# Patient Record
Sex: Male | Born: 1990 | Race: White | Hispanic: No | Smoking: Former smoker
Health system: Southern US, Community
[De-identification: ages and names within clinical notes are randomized; demographics above are authoritative.]

## PROBLEM LIST (undated history)

## (undated) DIAGNOSIS — Z8616 Personal history of COVID-19: Secondary | ICD-10-CM

## (undated) HISTORY — DX: Personal history of COVID-19: Z86.16

---

## 2020-01-07 ENCOUNTER — Emergency Department (HOSPITAL_COMMUNITY)
Admission: EM | Admit: 2020-01-07 | Discharge: 2020-01-07 | Disposition: A | Discharge status: Home / Self Care | Payer: 59 | Attending: Emergency Medicine | Admitting: Emergency Medicine

## 2020-01-07 ENCOUNTER — Encounter (HOSPITAL_COMMUNITY): Payer: Self-pay | Admitting: Emergency Medicine

## 2020-01-07 ENCOUNTER — Emergency Department (HOSPITAL_COMMUNITY): Payer: 59

## 2020-01-07 ENCOUNTER — Other Ambulatory Visit: Payer: Self-pay

## 2020-01-07 DIAGNOSIS — R059 Cough, unspecified: Secondary | ICD-10-CM | POA: Insufficient documentation

## 2020-01-07 DIAGNOSIS — Z20822 Contact with and (suspected) exposure to covid-19: Secondary | ICD-10-CM | POA: Insufficient documentation

## 2020-01-07 DIAGNOSIS — R519 Headache, unspecified: Secondary | ICD-10-CM | POA: Diagnosis not present

## 2020-01-07 DIAGNOSIS — R000 Tachycardia, unspecified: Secondary | ICD-10-CM | POA: Diagnosis not present

## 2020-01-07 DIAGNOSIS — R0602 Shortness of breath: Secondary | ICD-10-CM | POA: Insufficient documentation

## 2020-01-07 DIAGNOSIS — R0981 Nasal congestion: Secondary | ICD-10-CM | POA: Diagnosis not present

## 2020-01-07 DIAGNOSIS — R509 Fever, unspecified: Secondary | ICD-10-CM | POA: Insufficient documentation

## 2020-01-07 LAB — BASIC METABOLIC PANEL
Anion gap: 9 (ref 5–15)
BUN: 9 mg/dL (ref 6–20)
CO2: 24 mmol/L (ref 22–32)
Calcium: 9.2 mg/dL (ref 8.9–10.3)
Chloride: 105 mmol/L (ref 98–111)
Creatinine, Ser: 0.89 mg/dL (ref 0.61–1.24)
GFR, Estimated: >60 mL/min (ref >60)
Glucose, Bld: 125 mg/dL — ABNORMAL HIGH (ref 70–99)
Potassium: 3.9 mmol/L (ref 3.5–5.1)
Sodium: 138 mmol/L (ref 135–145)

## 2020-01-07 LAB — RESPIRATORY PANEL BY RT PCR (FLU A&B, COVID)
Influenza A by PCR: NEGATIVE
Influenza B by PCR: NEGATIVE
SARS Coronavirus 2 by RT PCR: NEGATIVE

## 2020-01-07 LAB — CBC WITH DIFFERENTIAL/PLATELET
Abs Immature Granulocytes: 0.13 10*3/uL — ABNORMAL HIGH (ref 0.00–0.07)
Basophils Absolute: 0.1 10*3/uL (ref 0.0–0.1)
Basophils Relative: 1 %
Eosinophils Absolute: 0.1 10*3/uL (ref 0.0–0.5)
Eosinophils Relative: 1 %
HCT: 40.2 % (ref 39.0–52.0)
Hemoglobin: 14.1 g/dL (ref 13.0–17.0)
Immature Granulocytes: 1 %
Lymphocytes Relative: 43 %
Lymphs Abs: 4.8 10*3/uL — ABNORMAL HIGH (ref 0.7–4.0)
MCH: 31 pg (ref 26.0–34.0)
MCHC: 35.1 g/dL (ref 30.0–36.0)
MCV: 88.4 fL (ref 80.0–100.0)
Monocytes Absolute: 0.6 10*3/uL (ref 0.1–1.0)
Monocytes Relative: 6 %
Neutro Abs: 5.3 10*3/uL (ref 1.7–7.7)
Neutrophils Relative %: 48 %
Platelets: 183 10*3/uL (ref 150–400)
RBC: 4.55 MIL/uL (ref 4.22–5.81)
RDW: 12.4 % (ref 11.5–15.5)
WBC: 11 10*3/uL — ABNORMAL HIGH (ref 4.0–10.5)
nRBC: 0 % (ref 0.0–0.2)

## 2020-01-07 LAB — URINALYSIS, ROUTINE W REFLEX MICROSCOPIC
Bilirubin Urine: NEGATIVE
Glucose, UA: NEGATIVE mg/dL
Hgb urine dipstick: NEGATIVE
Ketones, ur: NEGATIVE mg/dL
Leukocytes,Ua: NEGATIVE
Nitrite: NEGATIVE
Protein, ur: NEGATIVE mg/dL
Specific Gravity, Urine: 1.013 (ref 1.005–1.030)
pH: 8 (ref 5.0–8.0)

## 2020-01-07 LAB — LACTIC ACID, PLASMA: Lactic Acid, Venous: 1.6 mmol/L (ref 0.5–1.9)

## 2020-01-07 MED ORDER — SODIUM CHLORIDE 0.9 % IV BOLUS: 1000.0000 mL | Freq: Once | INTRAVENOUS | Status: DC

## 2020-01-07 MED ORDER — FLUTICASONE PROPIONATE 50 MCG/ACT NA SUSP: 2.0000 | Freq: Every day | NASAL | 0 refills | Status: AC

## 2020-01-07 MED ORDER — SODIUM CHLORIDE 0.9 % IV BOLUS
1000.0000 mL | Freq: Once | INTRAVENOUS | Status: AC
  Administered 2020-01-07: 1000 mL via INTRAVENOUS

## 2020-01-07 MED ORDER — ACETAMINOPHEN 500 MG PO TABS
1000.0000 mg | ORAL_TABLET | Freq: Once | ORAL | Status: AC
  Administered 2020-01-07: 1000 mg via ORAL
  Filled 2020-01-07: qty 2

## 2020-01-07 NOTE — ED Triage Notes (Signed)
Patient reports persistent fever since last week , diagnosed with sinusitis currently taking Amoxicillin , denies SOB or cough , denies loss or taste or smell , received Covid vaccination.

## 2020-01-07 NOTE — ED Provider Notes (Signed)
[facility] EMERGENCY DEPARTMENT Provider Note   CSN: [id number] Arrival date & time: 01/07/20  1949     History Chief Complaint  Patient presents with  . Fever    Arthur Ortega is a 29 y.o. male.  HPI   29 year old male presenting the emergency department today for evaluation of a fever.  Patient states for the last week he has had fevers, headaches, pressure to the sinuses, some nasal congestion and an intermittent cough.  He denies any chest pain.  He has had some intermittent shortness of breath when his fever gets high.  He denies any vomiting, diarrhea, urinary symptoms.  Denies any rashes or recent tick bites.  Denies any neck pain or stiffness.  He had an outpatient Covid test that was negative.  He has been fully vaccinated.  He was seen via telehealth visit yesterday and started on Augmentin.  He has had a total of 4 doses so far.  He became concerned because he had a temperature of 106F at home.  Denies any history of IV drug use.  He took motrin just PTA.   History reviewed. No pertinent past medical history.  There are no problems to display for this patient.   History reviewed. No pertinent surgical history.     No family history on file.  Social History   Tobacco Use  . Smoking status: Never Smoker  . Smokeless tobacco: Never Used  Substance Use Topics  . Alcohol use: Never  . Drug use: Never    Home Medications Prior to Admission medications   Medication Sig Start Date End Date Taking? Authorizing Provider  amoxicillin-clavulanate (AUGMENTIN) 875-125 MG tablet Take 1 tablet by mouth 2 (two) times daily. 01/06/20  Yes [provider]  SKYRIZI 150 MG/ML SOSY Inject 150 mg into the muscle every 3 (three) months. 11/12/19  Yes [provider]  fluticasone (FLONASE) 50 MCG/ACT nasal spray Place 2 sprays into both nostrils daily. 01/07/20   Cherre Kothari S, PA-C    Allergies    Patient has no known allergies.  Review  of Systems   Review of Systems  Constitutional: Positive for fever.  HENT: Positive for congestion and sinus pressure. Negative for ear pain.   Eyes: Negative for pain and visual disturbance.  Respiratory: Negative for cough and shortness of breath.   Cardiovascular: Negative for chest pain.  Gastrointestinal: Negative for abdominal pain, constipation, diarrhea, nausea and vomiting.  Genitourinary: Negative for dysuria and hematuria.  Musculoskeletal: Negative for back pain.  Skin: Negative for color change and rash.  Neurological: Positive for headaches.  All other systems reviewed and are negative.   Physical Exam Updated Vital Signs BP 101/63   Pulse (!) 107   Temp 99 F (37.2 C) (Oral)   Resp 16   Ht 5\' 6"  (1.676 m)   Wt 85 kg   SpO2 100%   BMI 30.25 kg/m   Physical Exam Vitals and nursing note reviewed.  Constitutional:      Appearance: He is well-developed. He is not ill-appearing.  HENT:     Head: Normocephalic and atraumatic.     Mouth/Throat:     Pharynx: No oropharyngeal exudate or posterior oropharyngeal erythema.  Eyes:     Extraocular Movements: Extraocular movements intact.     Conjunctiva/sclera: Conjunctivae normal.     Pupils: Pupils are equal, round, and reactive to light.  Cardiovascular:     Rate and Rhythm: Regular rhythm. Tachycardia present.     Heart sounds:  Normal heart sounds. No murmur heard.   Pulmonary:     Effort: Pulmonary effort is normal. No respiratory distress.     Breath sounds: Normal breath sounds. No wheezing or rales.  Abdominal:     General: Bowel sounds are normal.     Palpations: Abdomen is soft.     Tenderness: There is no abdominal tenderness.  Musculoskeletal:     Cervical back: Normal range of motion and neck supple. No rigidity.  Skin:    General: Skin is warm and dry.  Neurological:     Mental Status: He is alert.     Comments: Cranial nerves II-XII intact. Moving all extremities.     ED Results / Procedures  / Treatments   Labs (all labs ordered are listed, but only abnormal results are displayed) Labs Reviewed  CBC WITH DIFFERENTIAL/PLATELET - Abnormal; Notable for the following components:      Result Value   WBC 11.0 (*)    Lymphs Abs 4.8 (*)    Abs Immature Granulocytes 0.13 (*)    All other components within normal limits  BASIC METABOLIC PANEL - Abnormal; Notable for the following components:   Glucose, Bld 125 (*)    All other components within normal limits  RESPIRATORY PANEL BY RT PCR (FLU A&B, COVID)  CULTURE, BLOOD (ROUTINE X 2)  CULTURE, BLOOD (ROUTINE X 2)  URINALYSIS, ROUTINE W REFLEX MICROSCOPIC  LACTIC ACID, PLASMA    EKG EKG Interpretation  Date/Time:  Tuesday January 07 2020 20:07:26 EDT Ventricular Rate:  162 PR Interval:  124 QRS Duration: 76 QT Interval:  290 QTC Calculation: 476 R Axis:   51 Text Interpretation: Sinus tachycardia Septal infarct , age undetermined Abnormal ECG No old tracing to compare Confirmed by Meridee Score [phone]) on 01/07/2020 9:45:40 PM   Radiology DG Chest Portable 1 View  Result Date: 01/07/2020 CLINICAL DATA:  Fever since last week. EXAM: PORTABLE CHEST 1 VIEW COMPARISON:  None. FINDINGS: The cardiomediastinal contours are normal. The lungs are clear. Pulmonary vasculature is normal. No consolidation, pleural effusion, or pneumothorax. No acute osseous abnormalities are seen. IMPRESSION: Negative AP view of the chest. Electronically Signed   By: Narda Rutherford M.D.   On: 01/07/2020 20:57    Procedures Procedures (including critical care time)  Medications Ordered in ED Medications  sodium chloride 0.9 % bolus 1,000 mL ( Intravenous Restarted 01/07/20 2229)  acetaminophen (TYLENOL) tablet 1,000 mg (1,000 mg Oral Given 01/07/20 2222)    ED Course  I have reviewed the triage vital signs and the nursing notes.  Pertinent labs & imaging results that were available during my care of the patient were reviewed by me and  considered in my medical decision making (see chart for details).    MDM Rules/Calculators/A&P                          53 old male presenting the emergency department today for evaluation of fever and nasal congestion.  Has been on Augmentin for 2 days for sinus infection but became concerned because fevers were persistently high at home.  Initially is tachycardic and febrile.  He is not hypotensive, hypoxic and is well-appearing generally on exam.  Reviewed/interpreted labs CBC with very mild leukocytosis, no anemia BMP with normal electrolytes and renal function UA negative for UTI COVID negative Blood cultures obtained Lactic acid is negative, low suspicion for sepsis  EKG shows sinus tach with a heart rate of 162, age  intdeterminate septal infarct  CXR reviewed/interpreted -  Negative AP view of the chest  He was given Tylenol and IV fluids.  On reassessment his heart rate is improving. Low suspicion for sepsis at this time. sxs likely secondary to bacterial sinusitis. He has only had 2 days of outpatient abx, feel he has not necessarily failed outpatient therapy and should continue abx with close f/u with his pcp. Advised on specific return precautions. He voiced understanding of the plan and reasons to return. All questions answered, pt stable for discharge  Final Clinical Impression(s) / ED Diagnoses Final diagnoses:  Fever, unspecified fever cause    Rx / DC Orders ED Discharge Orders         Ordered    fluticasone (FLONASE) 50 MCG/ACT nasal spray  Daily        01/07/20 2327           Karrie Meres, PA-C 01/07/20 2332    Terrilee Files, MD 01/08/20 1233

## 2020-01-07 NOTE — ED Notes (Signed)
Fluids from 1st bolus still infusing. Pt resting comfortably

## 2020-01-07 NOTE — ED Notes (Signed)
Per PA ok to collect blood cultures after fluids

## 2020-01-07 NOTE — Discharge Instructions (Addendum)
Continue your antibiotics. Use 2 sprays of the fluticasone nasal spray to your bilateral nostrils once daily for nasal congestion.   A culture was sent of your blood today to determine if there is any bacterial growth. If the results of the culture are positive and you require an antibiotic or a change of your prescribed antibiotic you will be contacted by the hospital. If the results are negative you will not be contacted.  Please follow up with your primary care provider within 1-2 days for re-evaluation of your symptoms. If you do not have a primary care provider, information for a healthcare clinic has been provided for you to make arrangements for follow up care. Please return to the emergency department for any new or worsening symptoms.

## 2020-01-13 LAB — CULTURE, BLOOD (ROUTINE X 2)
Culture: NO GROWTH
Culture: NO GROWTH
Special Requests: ADEQUATE
Special Requests: ADEQUATE

## 2020-04-09 ENCOUNTER — Telehealth: Payer: 59 | Admitting: Family

## 2020-04-09 DIAGNOSIS — J019 Acute sinusitis, unspecified: Secondary | ICD-10-CM

## 2020-04-09 MED ORDER — AMOXICILLIN-POT CLAVULANATE 875-125 MG PO TABS: 1.0000 | ORAL_TABLET | Freq: Two times a day (BID) | ORAL | 0 refills | Status: AC

## 2020-04-09 NOTE — Progress Notes (Signed)
We are sorry that you are not feeling well.  Here is how we plan to help! ° °Based on what you have shared with me it looks like you have sinusitis.  Sinusitis is inflammation and infection in the sinus cavities of the head.  Based on your presentation I believe you most likely have Acute Bacterial Sinusitis.  This is an infection caused by bacteria and is treated with antibiotics. I have prescribed Augmentin 875mg/125mg one tablet twice daily with food, for 7 days. You may use an oral decongestant such as Mucinex D or if you have glaucoma or high blood pressure use plain Mucinex. Saline nasal spray help and can safely be used as often as needed for congestion.  If you develop worsening sinus pain, fever or notice severe headache and vision changes, or if symptoms are not better after completion of antibiotic, please schedule an appointment with a health care provider.   ° °Sinus infections are not as easily transmitted as other respiratory infection, however we still recommend that you avoid close contact with loved ones, especially the very young and elderly.  Remember to wash your hands thoroughly throughout the day as this is the number one way to prevent the spread of infection! ° °Home Care: °· Only take medications as instructed by your medical team. °· Complete the entire course of an antibiotic. °· Do not take these medications with alcohol. °· A steam or ultrasonic humidifier can help congestion.  You can place a towel over your head and breathe in the steam from hot water coming from a faucet. °· Avoid close contacts especially the very young and the elderly. °· Cover your mouth when you cough or sneeze. °· Always remember to wash your hands. ° °Get Help Right Away If: °· You develop worsening fever or sinus pain. °· You develop a severe head ache or visual changes. °· Your symptoms persist after you have completed your treatment plan. ° °Make sure you °· Understand these instructions. °· Will watch your  condition. °· Will get help right away if you are not doing well or get worse. ° °Your e-visit answers were reviewed by a board certified advanced clinical practitioner to complete your personal care plan.  Depending on the condition, your plan could have included both over the counter or prescription medications. ° °If there is a problem please reply  once you have received a response from your provider. ° °Your safety is important to us.  If you have drug allergies check your prescription carefully.   ° °You can use MyChart to ask questions about today’s visit, request a non-urgent call back, or ask for a work or school excuse for 24 hours related to this e-Visit. If it has been greater than 24 hours you will need to follow up with your provider, or enter a new e-Visit to address those concerns. ° °You will get an e-mail in the next two days asking about your experience.  I hope that your e-visit has been valuable and will speed your recovery. Thank you for using e-visits. ° °Greater than 5 minutes, yet less than 10 minutes of time have been spent researching, coordinating, and implementing care for this patient.  ° ° ° °

## 2020-04-16 ENCOUNTER — Emergency Department (HOSPITAL_COMMUNITY)
Admission: EM | Admit: 2020-04-16 | Discharge: 2020-04-16 | Disposition: A | Discharge status: Home / Self Care | Payer: Commercial Managed Care - PPO | Attending: Emergency Medicine | Admitting: Emergency Medicine

## 2020-04-16 ENCOUNTER — Encounter (HOSPITAL_COMMUNITY): Payer: Self-pay | Admitting: Emergency Medicine

## 2020-04-16 ENCOUNTER — Emergency Department (HOSPITAL_COMMUNITY): Payer: Commercial Managed Care - PPO

## 2020-04-16 DIAGNOSIS — U071 COVID-19: Secondary | ICD-10-CM | POA: Diagnosis not present

## 2020-04-16 DIAGNOSIS — R002 Palpitations: Secondary | ICD-10-CM | POA: Diagnosis not present

## 2020-04-16 DIAGNOSIS — R0602 Shortness of breath: Secondary | ICD-10-CM | POA: Diagnosis present

## 2020-04-16 DIAGNOSIS — R000 Tachycardia, unspecified: Secondary | ICD-10-CM | POA: Diagnosis not present

## 2020-04-16 DIAGNOSIS — F419 Anxiety disorder, unspecified: Secondary | ICD-10-CM

## 2020-04-16 LAB — CBC
HCT: 47.5 % (ref 39.0–52.0)
Hemoglobin: 16.9 g/dL (ref 13.0–17.0)
MCH: 30.8 pg (ref 26.0–34.0)
MCHC: 35.6 g/dL (ref 30.0–36.0)
MCV: 86.7 fL (ref 80.0–100.0)
Platelets: 314 10*3/uL (ref 150–400)
RBC: 5.48 MIL/uL (ref 4.22–5.81)
RDW: 12.6 % (ref 11.5–15.5)
WBC: 14.6 10*3/uL — ABNORMAL HIGH (ref 4.0–10.5)
nRBC: 0 % (ref 0.0–0.2)

## 2020-04-16 LAB — URINALYSIS, ROUTINE W REFLEX MICROSCOPIC
Bilirubin Urine: NEGATIVE
Glucose, UA: NEGATIVE mg/dL
Hgb urine dipstick: NEGATIVE
Ketones, ur: NEGATIVE mg/dL
Leukocytes,Ua: NEGATIVE
Nitrite: NEGATIVE
Protein, ur: NEGATIVE mg/dL
Specific Gravity, Urine: 1.01 (ref 1.005–1.030)
pH: 7 (ref 5.0–8.0)

## 2020-04-16 LAB — BASIC METABOLIC PANEL
Anion gap: 11 (ref 5–15)
BUN: 9 mg/dL (ref 6–20)
CO2: 22 mmol/L (ref 22–32)
Calcium: 9.8 mg/dL (ref 8.9–10.3)
Chloride: 104 mmol/L (ref 98–111)
Creatinine, Ser: 0.78 mg/dL (ref 0.61–1.24)
GFR, Estimated: >60 mL/min (ref >60)
Glucose, Bld: 136 mg/dL — ABNORMAL HIGH (ref 70–99)
Potassium: 4 mmol/L (ref 3.5–5.1)
Sodium: 137 mmol/L (ref 135–145)

## 2020-04-16 LAB — SARS CORONAVIRUS 2 BY RT PCR (HOSPITAL ORDER, PERFORMED IN ~~LOC~~ HOSPITAL LAB): SARS Coronavirus 2: POSITIVE — AB

## 2020-04-16 LAB — RAPID URINE DRUG SCREEN, HOSP PERFORMED
Amphetamines: NOT DETECTED
Barbiturates: NOT DETECTED
Benzodiazepines: NOT DETECTED
Cocaine: NOT DETECTED
Opiates: NOT DETECTED
Tetrahydrocannabinol: NOT DETECTED

## 2020-04-16 LAB — TROPONIN I (HIGH SENSITIVITY)
Troponin I (High Sensitivity): 3 ng/L (ref <18)
Troponin I (High Sensitivity): 3 ng/L (ref <18)

## 2020-04-16 LAB — TSH: TSH: 1.393 u[IU]/mL (ref 0.350–4.500)

## 2020-04-16 MED ORDER — ACETAMINOPHEN 500 MG PO TABS
1000.0000 mg | ORAL_TABLET | Freq: Once | ORAL | Status: AC
  Administered 2020-04-16: 1000 mg via ORAL
  Filled 2020-04-16: qty 2

## 2020-04-16 MED ORDER — LORAZEPAM 1 MG PO TABS: 1.0000 mg | ORAL_TABLET | Freq: Two times a day (BID) | ORAL | 0 refills | Status: AC | PRN

## 2020-04-16 MED ORDER — LACTATED RINGERS IV BOLUS
1000.0000 mL | Freq: Once | INTRAVENOUS | Status: AC
  Administered 2020-04-16: 1000 mL via INTRAVENOUS

## 2020-04-16 MED ORDER — METOPROLOL TARTRATE 5 MG/5ML IV SOLN
5.0000 mg | Freq: Once | INTRAVENOUS | Status: AC
  Administered 2020-04-16: 5 mg via INTRAVENOUS
  Filled 2020-04-16: qty 5

## 2020-04-16 MED ORDER — IOHEXOL 350 MG/ML SOLN
75.0000 mL | Freq: Once | INTRAVENOUS | Status: AC | PRN
  Administered 2020-04-16: 75 mL via INTRAVENOUS

## 2020-04-16 MED ORDER — SODIUM CHLORIDE 0.9 % IV BOLUS
1000.0000 mL | Freq: Once | INTRAVENOUS | Status: AC
  Administered 2020-04-16: 1000 mL via INTRAVENOUS

## 2020-04-16 MED ORDER — METOPROLOL SUCCINATE ER 25 MG PO TB24
12.5000 mg | ORAL_TABLET | Freq: Every day | ORAL | 0 refills | Status: DC
Start: 2020-04-16 — End: 2020-05-14

## 2020-04-16 MED ORDER — LORAZEPAM 2 MG/ML IJ SOLN
1.0000 mg | Freq: Once | INTRAMUSCULAR | Status: AC
  Administered 2020-04-16: 1 mg via INTRAVENOUS
  Filled 2020-04-16: qty 1

## 2020-04-16 NOTE — Discharge Instructions (Addendum)
Like we discussed, continue to treat your symptoms with over-the-counter medications.  You can take Tylenol for your fevers and body aches.  Consider Delsym or Robitussin for your cough.  I would also recommend that you take Flonase 2 times a day.  This medication has been shown in a recent study to have a significant reduction in progression of COVID-19 in folks like you who have mild symptoms.  I have also given you a referral for COVID-19 treatment.  They will be reaching out to you regarding possible outpatient options for treatment of your COVID-19.  If you develop worsening shortness of breath or chest pain, consider coming back to the emergency department for reevaluation.  It was a pleasure to meet you.

## 2020-04-16 NOTE — ED Provider Notes (Signed)
Pt is feeling much better after fluids, ativan, and lopressor.  HR is down to 107.  Pt will be d/c with a weeks worth of lopressor and a few ativan.  He is stable for d/c.  Return if worse.  Arthur Ortega was evaluated in Emergency Department on 04/16/2020 for the symptoms described in the history of present illness. He was evaluated in the context of the global COVID-19 pandemic, which necessitated consideration that the patient might be at risk for infection with the SARS-CoV-2 virus that causes COVID-19. Institutional protocols and algorithms that pertain to the evaluation of patients at risk for COVID-19 are in a state of rapid change based on information released by regulatory bodies including the CDC and federal and state organizations. These policies and algorithms were followed during the patient's care in the ED.   Jacalyn Lefevre, MD 04/16/20 [phone]

## 2020-04-16 NOTE — ED Provider Notes (Signed)
I provided a substantive portion of the care of this patient.  I personally performed the entirety of the history for this encounter.  EKG Interpretation  Date/Time:  Thursday April 16 2020 08:20:10 EST Ventricular Rate:  159 PR Interval:  120 QRS Duration: 72 QT Interval:  244 QTC Calculation: 396 R Axis:   52 Text Interpretation: Sinus tachycardia Otherwise normal ECG agree, no sig change from previous Confirmed by Arby Barrette [phone]) on 04/16/2020 8:42:53 AM   Patient reports he had a week or more of nasal congestion.  He tried some home Covid test that were negative.  He was treated with Augmentin.  His son just recently tested positive for Covid and so he took another test yesterday which was positive.  Last night his heart rate was going very high.  He was measuring it and it was up in the 160s.  He reports he could feel his heart racing and felt his chest somewhat tight.  No history of DVT.  Patient denies any drugs of abuse.  He has been taking some Mucinex DM.  Last dose was yesterday morning.  He denies taking frequent doses of cold medication.  No extensive consumption of caffeine.  No drugs of abuse.  Patient is alert and nontoxic.  Mental status clear.  No distress at rest.  Heart regular and tachycardic.  Lungs are clear.  Abdomen soft nontender.  No peripheral edema.  Calf soft nontender.  Patient does test positive for COVID.  He is persistently tachycardic.  There was some temporary improvement after his first liter of fluid rehydration.  Patient does report subjectively feeling better.  Heart rate remains elevated will continue to hydrate and reassess.  This time PE study is negative.   Arby Barrette, MD 04/16/20 [phone]

## 2020-04-16 NOTE — ED Triage Notes (Signed)
Pt reports being sick with a upper respiratory infection for the past 14 days, he was seen on 1/27 and diagnosed with sinusitis and just finished course on antibiotics. He took negative covid tests 2 weeks ago however his son tested + 1 week ago and he took a + covid test yesterday. Last night he began to feel short of breath and his heart was racing.

## 2020-04-16 NOTE — ED Provider Notes (Signed)
[facility] EMERGENCY DEPARTMENT Provider Note   CSN: [id number] Arrival date & time: 04/16/20  0813     History Chief Complaint  Patient presents with  . Shortness of Breath    Covid +     Arthur Ortega is a 30 y.o. male.  HPI Patient is a 30 year old male with a history of psoriasis who presents the emergency department due to shortness of breath.  Patient states about 2 weeks ago he began experiencing a viral URI.  He had a negative COVID-19 test at that time and was prescribed Augmentin which he completed yesterday.  He states that his young son recently tested positive for COVID-19 so he used an at-home test once again yesterday which he found to be positive.  Last night he began experiencing chest tightness, palpitations, as well as shortness of breath.  He felt that it was likely due to anxiety so he went to sleep and woke up this morning with a continuation of his symptoms so he came to the emergency department.  No nausea, vomiting, abdominal pain, URI symptoms at this time.  Denies any leg swelling or history of blood clots.  No hemoptysis.    History reviewed. No pertinent past medical history.  There are no problems to display for this patient.   No past surgical history on file.     No family history on file.  Social History   Tobacco Use  . Smoking status: Never Smoker  . Smokeless tobacco: Never Used  Substance Use Topics  . Alcohol use: Never  . Drug use: Never    Home Medications Prior to Admission medications   Medication Sig Start Date End Date Taking? Authorizing Provider  amoxicillin-clavulanate (AUGMENTIN) 875-125 MG tablet Take 1 tablet by mouth 2 (two) times daily for 7 days. 04/09/20 04/16/20 Yes Olive Bass, FNP  fluticasone (FLONASE) 50 MCG/ACT nasal spray Place 2 sprays into both nostrils daily. Patient taking differently: Place 2 sprays into both nostrils daily as needed for allergies or rhinitis. 01/07/20  Yes  Couture, Cortni S, PA-C  ibuprofen (ADVIL) 200 MG tablet Take 600 mg by mouth every 6 (six) hours as needed for mild pain, headache or fever (or discomfort).   Yes [provider]  Multiple Vitamins-Minerals (EMERGEN-C IMMUNE PLUS/VIT D PO) Take 1-2 tablets by mouth daily.   Yes [provider]  pseudoephedrine-guaifenesin (MUCINEX D) 60-600 MG 12 hr tablet Take 1 tablet by mouth every 12 (twelve) hours as needed for congestion.   Yes [provider]  SKYRIZI 150 MG/ML SOSY Inject 150 mg into the muscle every 3 (three) months. 11/12/19  Yes [provider]  augmented betamethasone dipropionate (DIPROLENE-AF) 0.05 % cream Apply topically. 04/09/20   [provider]  Clobetasol Propionate 0.05 % lotion Apply topically. 04/10/20   [provider]  ketoconazole (NIZORAL) 2 % shampoo Apply topically. 04/09/20   [provider]    Allergies    Patient has no known allergies.  Review of Systems   Review of Systems  All other systems reviewed and are negative. Ten systems reviewed and are negative for acute change, except as noted in the HPI.   Physical Exam Updated Vital Signs BP 118/80   Pulse (!) 146   Temp 99.3 F (37.4 C) (Oral)   Resp 17   SpO2 100%   Physical Exam Vitals and nursing note reviewed.  Constitutional:      General: He is not in acute distress.  Appearance: Normal appearance. He is well-developed and normal weight. He is not ill-appearing, toxic-appearing or diaphoretic.     Interventions: He is not intubated. HENT:     Head: Normocephalic and atraumatic.     Right Ear: External ear normal.     Left Ear: External ear normal.     Nose: Nose normal.     Mouth/Throat:     Mouth: Mucous membranes are moist.     Pharynx: Oropharynx is clear. No oropharyngeal exudate or posterior oropharyngeal erythema.  Eyes:     Extraocular Movements: Extraocular movements intact.  Cardiovascular:     Rate and Rhythm:  Regular rhythm. Tachycardia present.  No extrasystoles are present.    Pulses: Normal pulses. No decreased pulses.     Heart sounds: Normal heart sounds. No murmur heard. No friction rub. No gallop.      Comments: Tachycardic.  Fluctuating between 140 to 160 bpm. Pulmonary:     Effort: Pulmonary effort is normal. No tachypnea, bradypnea, accessory muscle usage or respiratory distress. He is not intubated.     Breath sounds: Normal breath sounds. No stridor. No decreased breath sounds, wheezing, rhonchi or rales.     Comments: LCTAB.  No wheezing, rales, or rhonchi.  Speaking in clear, complete, and coherent sentences. Abdominal:     General: Abdomen is flat.     Tenderness: There is no abdominal tenderness.  Musculoskeletal:        General: Normal range of motion.     Cervical back: Normal range of motion and neck supple. No tenderness.     Right lower leg: No tenderness. No edema.     Left lower leg: No tenderness. No edema.     Comments: No leg swelling or calf tenderness.  Skin:    General: Skin is warm and dry.  Neurological:     General: No focal deficit present.     Mental Status: He is alert and oriented to person, place, and time.  Psychiatric:        Mood and Affect: Mood normal.        Behavior: Behavior normal.    ED Results / Procedures / Treatments   Labs (all labs ordered are listed, but only abnormal results are displayed) Labs Reviewed  SARS CORONAVIRUS 2 BY RT PCR (HOSPITAL ORDER, PERFORMED IN [facility]) - Abnormal; Notable for the following components:      Result Value   SARS Coronavirus 2 POSITIVE (*)    All other components within normal limits  BASIC METABOLIC PANEL - Abnormal; Notable for the following components:   Glucose, Bld 136 (*)    All other components within normal limits  CBC - Abnormal; Notable for the following components:   WBC 14.6 (*)    All other components within normal limits  URINALYSIS, ROUTINE W REFLEX MICROSCOPIC   RAPID URINE DRUG SCREEN, HOSP PERFORMED  TSH  TROPONIN I (HIGH SENSITIVITY)  TROPONIN I (HIGH SENSITIVITY)   EKG EKG Interpretation  Date/Time:  Thursday April 16 2020 08:20:10 EST Ventricular Rate:  159 PR Interval:  120 QRS Duration: 72 QT Interval:  244 QTC Calculation: 396 R Axis:   52 Text Interpretation: Sinus tachycardia Otherwise normal ECG agree, no sig change from previous Confirmed by Arby Barrette [phone]) on 04/16/2020 8:42:53 AM  Radiology CT Angio Chest PE W and/or Wo Contrast  Result Date: 04/16/2020 CLINICAL DATA:  URI.  Heart racing.  Recent COVID exposure. EXAM: CT ANGIOGRAPHY CHEST WITH CONTRAST TECHNIQUE: Multidetector  CT imaging of the chest was performed using the standard protocol during bolus administration of intravenous contrast. Multiplanar CT image reconstructions and MIPs were obtained to evaluate the vascular anatomy. CONTRAST:  44mL OMNIPAQUE IOHEXOL 350 MG/ML SOLN COMPARISON:  None. FINDINGS: Cardiovascular: Satisfactory opacification of the pulmonary arteries to the segmental level. No evidence of pulmonary embolism when allowing for levels of motion artifact. Normal heart size. No pericardial effusion. Mediastinum/Nodes: Negative for adenopathy or mass. Lungs/Pleura: No pneumonia, edema, effusion, or air leak Upper Abdomen: Negative when accounting for early contrast excretion in the kidneys. Musculoskeletal: Negative Review of the MIP images confirms the above findings. IMPRESSION: Negative chest CTA.  No pulmonary embolism or pneumonia. Electronically Signed   By: Marnee Spring M.D.   On: 04/16/2020 11:03    Procedures Procedures   Medications Ordered in ED Medications  LORazepam (ATIVAN) injection 1 mg (has no administration in time range)  acetaminophen (TYLENOL) tablet 1,000 mg (1,000 mg Oral Given 04/16/20 0914)  iohexol (OMNIPAQUE) 350 MG/ML injection 75 mL (75 mLs Intravenous Contrast Given 04/16/20 1056)  sodium chloride 0.9 % bolus 1,000 mL  (0 mLs Intravenous Stopped 04/16/20 1302)  lactated ringers bolus 1,000 mL (1,000 mLs Intravenous New Bag/Given 04/16/20 1507)    ED Course  I have reviewed the triage vital signs and the nursing notes.  Pertinent labs & imaging results that were available during my care of the patient were reviewed by me and considered in my medical decision making (see chart for details).  Clinical Course as of 04/16/20 1539  Thu Apr 16, 2020  0853 WBC(!): 14.6 [LJ]  0927 Temp(!): 100.8 F (38.2 C) Given 1 g of Tylenol. [LJ]  1106 CT Angio Chest PE W and/or Wo Contrast IMPRESSION: Negative chest CTA. No pulmonary embolism or pneumonia. [LJ]  1106 Patient reassessed.  Heart rate improved to about 130 to 135 bpm.  We will give IV fluids.  We will continue to monitor. [LJ]  1116 SARS Coronavirus 2(!): POSITIVE [LJ]  1342 Patient reassessed.  Heart rate improved to the 90s but with any movement continues to be significantly tachycardic.  Patient discussed with and evaluated by my attending physician.  We will give an additional liter of IV fluids.  Will have nursing staff obtain orthostatics as well as ambulate patient with a pulse ox. [LJ]    Clinical Course User Index [LJ] Placido Sou, PA-C   MDM Rules/Calculators/A&P                          Pt is a 30 y.o. male patient presents to the emergency department with tachycardia, chest tightness, shortness of breath.  Found to be positive for COVID-19 today.  He has been vaccinated.  Not boosted.  Labs: CBC with a leukocytosis of 14.6, otherwise reassuring. BMP with a glucose of 136, otherwise reassuring.  Electrolytes within normal limits.  Normal kidney function. TSH within normal limits. COVID-19 test positive. Troponin of 3. UA negative. UDS negative.  Imaging: CTA of the chest was obtained which was negative.  No PE or pneumonia.  I, Placido Sou, PA-C, personally reviewed and evaluated these images and lab results as part of my medical  decision-making.  Patient initially experiencing sinus tachycardia around 150 bpm.  He was also found to be febrile at 100.8 F.  Given his tachycardia I obtained a CT of the chest which was negative.  Patient given APAP as well as IV fluids.  Fever is resolved.  Tachycardia has mildly improved but patient still significantly tachycardic around 130 bpm.  Discussed with evaluated by my attending physician as well.  Plan is to give additional IV fluids, Ativan, obtain orthostatic vital signs, as well as have the nursing staff ambulate patient and monitor his pulse ox.  It is the end of my shift and pt care is being transferred to Dr. Particia Nearing for reevaluation and disposition.   Note: Portions of this report may have been transcribed using voice recognition software. Every effort was made to ensure accuracy; however, inadvertent computerized transcription errors may be present.   Final Clinical Impression(s) / ED Diagnoses Final diagnoses:  COVID-19   Rx / DC Orders ED Discharge Orders         Ordered    Ambulatory referral for Covid Treatment        04/16/20 1316           Placido Sou, PA-C 04/16/20 1539    Arby Barrette, MD 04/21/20 [phone]

## 2020-05-06 ENCOUNTER — Encounter (HOSPITAL_COMMUNITY): Payer: Self-pay | Admitting: Emergency Medicine

## 2020-05-06 ENCOUNTER — Emergency Department (HOSPITAL_COMMUNITY): Payer: Commercial Managed Care - PPO

## 2020-05-06 ENCOUNTER — Emergency Department (HOSPITAL_COMMUNITY)
Admission: EM | Admit: 2020-05-06 | Discharge: 2020-05-06 | Disposition: A | Discharge status: Home / Self Care | Payer: Commercial Managed Care - PPO | Attending: Emergency Medicine | Admitting: Emergency Medicine

## 2020-05-06 ENCOUNTER — Emergency Department (HOSPITAL_BASED_OUTPATIENT_CLINIC_OR_DEPARTMENT_OTHER): Payer: Commercial Managed Care - PPO

## 2020-05-06 DIAGNOSIS — R0602 Shortness of breath: Secondary | ICD-10-CM | POA: Insufficient documentation

## 2020-05-06 DIAGNOSIS — R079 Chest pain, unspecified: Secondary | ICD-10-CM | POA: Insufficient documentation

## 2020-05-06 DIAGNOSIS — R42 Dizziness and giddiness: Secondary | ICD-10-CM | POA: Insufficient documentation

## 2020-05-06 DIAGNOSIS — R000 Tachycardia, unspecified: Secondary | ICD-10-CM | POA: Diagnosis not present

## 2020-05-06 LAB — CBC
HCT: 45.7 % (ref 39.0–52.0)
Hemoglobin: 16 g/dL (ref 13.0–17.0)
MCH: 31.1 pg (ref 26.0–34.0)
MCHC: 35 g/dL (ref 30.0–36.0)
MCV: 88.9 fL (ref 80.0–100.0)
Platelets: 249 10*3/uL (ref 150–400)
RBC: 5.14 MIL/uL (ref 4.22–5.81)
RDW: 13.1 % (ref 11.5–15.5)
WBC: 14.2 10*3/uL — ABNORMAL HIGH (ref 4.0–10.5)
nRBC: 0 % (ref 0.0–0.2)

## 2020-05-06 LAB — CBG MONITORING, ED: Glucose-Capillary: 147 mg/dL — ABNORMAL HIGH (ref 70–99)

## 2020-05-06 LAB — URINALYSIS, ROUTINE W REFLEX MICROSCOPIC
Bilirubin Urine: NEGATIVE
Glucose, UA: 150 mg/dL — AB
Hgb urine dipstick: NEGATIVE
Ketones, ur: NEGATIVE mg/dL
Leukocytes,Ua: NEGATIVE
Nitrite: NEGATIVE
Protein, ur: NEGATIVE mg/dL
Specific Gravity, Urine: 1.002 — ABNORMAL LOW (ref 1.005–1.030)
pH: 7 (ref 5.0–8.0)

## 2020-05-06 LAB — COMPREHENSIVE METABOLIC PANEL
ALT: 44 U/L (ref 0–44)
AST: 34 U/L (ref 15–41)
Albumin: 4.7 g/dL (ref 3.5–5.0)
Alkaline Phosphatase: 104 U/L (ref 38–126)
Anion gap: 13 (ref 5–15)
BUN: 12 mg/dL (ref 6–20)
CO2: 21 mmol/L — ABNORMAL LOW (ref 22–32)
Calcium: 10.1 mg/dL (ref 8.9–10.3)
Chloride: 104 mmol/L (ref 98–111)
Creatinine, Ser: 0.75 mg/dL (ref 0.61–1.24)
GFR, Estimated: >60 mL/min (ref >60)
Glucose, Bld: 144 mg/dL — ABNORMAL HIGH (ref 70–99)
Potassium: 3.9 mmol/L (ref 3.5–5.1)
Sodium: 138 mmol/L (ref 135–145)
Total Bilirubin: 0.6 mg/dL (ref 0.3–1.2)
Total Protein: 7.2 g/dL (ref 6.5–8.1)

## 2020-05-06 LAB — RAPID URINE DRUG SCREEN, HOSP PERFORMED
Amphetamines: NOT DETECTED
Barbiturates: NOT DETECTED
Benzodiazepines: NOT DETECTED
Cocaine: NOT DETECTED
Opiates: NOT DETECTED
Tetrahydrocannabinol: NOT DETECTED

## 2020-05-06 LAB — HEMOGLOBIN A1C
Hgb A1c MFr Bld: 4.9 % (ref 4.8–5.6)
Mean Plasma Glucose: 93.93 mg/dL

## 2020-05-06 LAB — TSH: TSH: 1.707 u[IU]/mL (ref 0.350–4.500)

## 2020-05-06 LAB — T4, FREE: Free T4: 0.84 ng/dL (ref 0.61–1.12)

## 2020-05-06 LAB — ECHOCARDIOGRAM COMPLETE
Area-P 1/2: 4.29 cm2
S' Lateral: 2.9 cm

## 2020-05-06 LAB — SEDIMENTATION RATE: Sed Rate: 0 mm/hr (ref 0–16)

## 2020-05-06 MED ORDER — METOPROLOL SUCCINATE ER 25 MG PO TB24: 25.0000 mg | ORAL_TABLET | Freq: Every day | ORAL | 1 refills | Status: DC

## 2020-05-06 MED ORDER — METOPROLOL TARTRATE 5 MG/5ML IV SOLN
5.0000 mg | Freq: Once | INTRAVENOUS | Status: AC
  Administered 2020-05-06: 5 mg via INTRAVENOUS
  Filled 2020-05-06: qty 5

## 2020-05-06 MED ORDER — METOPROLOL SUCCINATE ER 25 MG PO TB24
12.5000 mg | ORAL_TABLET | Freq: Every day | ORAL | Status: DC
  Administered 2020-05-06: 12.5 mg via ORAL
  Filled 2020-05-06: qty 1

## 2020-05-06 NOTE — Progress Notes (Signed)
  Echocardiogram 2D Echocardiogram has been performed.  Augustine Radar 05/06/2020, 12:16 PM

## 2020-05-06 NOTE — ED Provider Notes (Signed)
[facility] EMERGENCY DEPARTMENT Provider Note   CSN: [id number] Arrival date & time: 05/06/20  4098     History Chief Complaint  Patient presents with  . Dizziness    Ryne Mctigue is a 30 y.o. male.  HPI Patient was seen in the emergency department February 2 post COVID diagnosis with lightheadedness and tachycardia.  Clinically he had no respiratory distress and was nontoxic.  Patient was hydrated and given some Ativan for acutely stressful life event, with the thought that could be a contributing factor for tachycardia because clinically, the patient was not ill in appearance.  He was seen by myself at that time.  Patient was subsequently discharged with a prescription for metoprolol and alprazolam for prn use with recommended follow up.  Patient reports after his discharge, he took the alprazolam and metoprolol dose as recommended the following day.  Symptoms seem reasonably better and he did not take any more of these medications.  He reports this morning he got up and went to work.  He ate a normal snack before going in.  He reports then his heart started to race very fast and he started to feel lightheaded.  He reports he wears his apple watch and heart rates were up in the 160s.  He actually had his first follow-up appointment with the PCP scheduled for this morning.  He reports he wanted to wait for that appointment but the racing heart was persisting for so long he was starting to feel quite lightheaded and uncomfortable.  He tried one of the alprazolam and metoprolol as prescribed from his previous visit.  His symptoms did not really seem to be improving much.  At that point he determined to come to the emergency department.  He denies he is experiencing any chest pain with this.  He reports he does feel a slight fullness and shortness of breath when his heart is beating really fast.  When we got into more details, patient reports that he used to drink energy drinks but  has completely discontinued them since his visit to the emergency department hoping that this would not recur.    History reviewed. No pertinent past medical history.  There are no problems to display for this patient.   History reviewed. No pertinent surgical history.     No family history on file.  Social History   Tobacco Use  . Smoking status: Never Smoker  . Smokeless tobacco: Never Used  Substance Use Topics  . Alcohol use: Never  . Drug use: Never    Home Medications Prior to Admission medications   Medication Sig Start Date End Date Taking? Authorizing Provider  acetaminophen (TYLENOL) 500 MG tablet Take 500 mg by mouth every 6 (six) hours as needed for mild pain or headache.   Yes [provider]  cholecalciferol (VITAMIN D3) 25 MCG (1000 UNIT) tablet Take 1,000 Units by mouth daily.   Yes [provider]  fluticasone (FLONASE) 50 MCG/ACT nasal spray Place 2 sprays into both nostrils daily. Patient taking differently: Place 2 sprays into both nostrils daily as needed for allergies or rhinitis. 01/07/20  Yes Couture, Cortni S, PA-C  LORazepam (ATIVAN) 1 MG tablet Take 1 tablet (1 mg total) by mouth 2 (two) times daily as needed for anxiety. 04/16/20  Yes Jacalyn Lefevre, MD  metoprolol succinate (TOPROL-XL) 25 MG 24 hr tablet Take 0.5 tablets (12.5 mg total) by mouth daily. Take if HR is greater than 100 Patient taking differently: Take 12.5  mg by mouth daily as needed (if HR > 100). 04/16/20  Yes Jacalyn Lefevre, MD  metoprolol succinate (TOPROL-XL) 25 MG 24 hr tablet Take 1 tablet (25 mg total) by mouth daily. 05/06/20  Yes Arby Barrette, MD  Multiple Vitamins-Minerals (EMERGEN-C IMMUNE PLUS/VIT D PO) Take 1 tablet by mouth daily.   Yes [provider]  SKYRIZI 150 MG/ML SOSY Inject 150 mg into the muscle every 3 (three) months. 11/12/19  Yes [provider]  augmented betamethasone dipropionate (DIPROLENE-AF) 0.05 % cream Apply 1  application topically. 04/09/20   [provider]  Clobetasol Propionate 0.05 % lotion Apply topically. 04/10/20   [provider]  ketoconazole (NIZORAL) 2 % shampoo Apply 1 application topically 2 (two) times a week. 04/09/20   [provider]    Allergies    Patient has no known allergies.  Review of Systems   Review of Systems  Physical Exam Updated Vital Signs BP 126/79   Pulse (!) 129   Temp 98.3 F (36.8 C) (Oral)   Resp 20   SpO2 97%   Physical Exam  ED Results / Procedures / Treatments   Labs (all labs ordered are listed, but only abnormal results are displayed) Labs Reviewed  CBC - Abnormal; Notable for the following components:      Result Value   WBC 14.2 (*)    All other components within normal limits  URINALYSIS, ROUTINE W REFLEX MICROSCOPIC - Abnormal; Notable for the following components:   Color, Urine COLORLESS (*)    Specific Gravity, Urine 1.002 (*)    Glucose, UA 150 (*)    All other components within normal limits  COMPREHENSIVE METABOLIC PANEL - Abnormal; Notable for the following components:   CO2 21 (*)    Glucose, Bld 144 (*)    All other components within normal limits  CBG MONITORING, ED - Abnormal; Notable for the following components:   Glucose-Capillary 147 (*)    All other components within normal limits  RAPID URINE DRUG SCREEN, HOSP PERFORMED  SEDIMENTATION RATE  TSH  T4, FREE  HEMOGLOBIN A1C    EKG EKG Interpretation  Date/Time:  Wednesday May 06 2020 10:59:26 EST Ventricular Rate:  127 PR Interval:    QRS Duration: 85 QT Interval:  303 QTC Calculation: 441 R Axis:   28 Text Interpretation: Sinus tachycardia Ventricular premature complex Aberrant complex no sig change from previous. questionable subtle delta wave. Confirmed by Arby Barrette [phone]) on 05/06/2020 11:03:58 AM   Radiology DG Chest 2 View  Result Date: 05/06/2020 CLINICAL DATA:  Tachycardia EXAM: CHEST - 2 VIEW COMPARISON:   Chest radiograph January 07, 2020; chest CT April 16, 2020 FINDINGS: Lungs are clear. Heart size and pulmonary vascularity are normal. No adenopathy. No pneumothorax. No bone lesions. IMPRESSION: Lungs clear.  Cardiac silhouette normal. Electronically Signed   By: Bretta Bang III M.D.   On: 05/06/2020 09:03   ECHOCARDIOGRAM COMPLETE  Result Date: 05/06/2020    ECHOCARDIOGRAM REPORT   Patient Name:   AVEDIS BEVIS Date of Exam: 05/06/2020 Medical Rec #:  [id number]   Height:       66.0 in Accession #:    [id number]  Weight:       187.4 lb Date of Birth:  09-19-1990   BSA:          1.945 m Patient Age:    29 years    BP:           116/84 mmHg Patient Gender:  M           HR:           123 bpm. Exam Location:  Inpatient Procedure: Color Doppler and Cardiac Doppler Indications:    Tachycardia  History:        Patient has no prior history of Echocardiogram examinations.  Sonographer:    Eulah PontSarah Pirrotta RDCS Referring Phys: [id number] [facility] Jaslynn Thome IMPRESSIONS  1. Left ventricular ejection fraction, by estimation, is 60 to 65%. The left ventricle has normal function. The left ventricle has no regional wall motion abnormalities. Left ventricular diastolic parameters were normal.  2. Right ventricular systolic function is normal. The right ventricular size is normal.  3. The mitral valve is normal in structure. No evidence of mitral valve regurgitation. No evidence of mitral stenosis.  4. The aortic valve is normal in structure. Aortic valve regurgitation is not visualized. No aortic stenosis is present.  5. The inferior vena cava is normal in size with greater than 50% respiratory variability, suggesting right atrial pressure of 3 mmHg. FINDINGS  Left Ventricle: Left ventricular ejection fraction, by estimation, is 60 to 65%. The left ventricle has normal function. The left ventricle has no regional wall motion abnormalities. The left ventricular internal cavity size was normal in size. There is  no left ventricular  hypertrophy. Left ventricular diastolic parameters were normal. Normal left ventricular filling pressure. Right Ventricle: The right ventricular size is normal. No increase in right ventricular wall thickness. Right ventricular systolic function is normal. Left Atrium: Left atrial size was normal in size. Right Atrium: Right atrial size was normal in size. Pericardium: There is no evidence of pericardial effusion. Mitral Valve: The mitral valve is normal in structure. No evidence of mitral valve regurgitation. No evidence of mitral valve stenosis. Tricuspid Valve: The tricuspid valve is normal in structure. Tricuspid valve regurgitation is trivial. No evidence of tricuspid stenosis. Aortic Valve: The aortic valve is normal in structure. Aortic valve regurgitation is not visualized. No aortic stenosis is present. Pulmonic Valve: The pulmonic valve was normal in structure. Pulmonic valve regurgitation is not visualized. No evidence of pulmonic stenosis. Aorta: The aortic root is normal in size and structure. Venous: The inferior vena cava is normal in size with greater than 50% respiratory variability, suggesting right atrial pressure of 3 mmHg. IAS/Shunts: No atrial level shunt detected by color flow Doppler.  LEFT VENTRICLE PLAX 2D LVIDd:         4.00 cm  Diastology LVIDs:         2.90 cm  LV e' medial:    10.10 cm/s LV PW:         0.80 cm  LV E/e' medial:  7.0 LV IVS:        0.80 cm  LV e' lateral:   13.60 cm/s LVOT diam:     2.10 cm  LV E/e' lateral: 5.2 LV SV:         46 LV SV Index:   24 LVOT Area:     3.46 cm  RIGHT VENTRICLE RV S prime:     21.70 cm/s TAPSE (M-mode): 1.8 cm LEFT ATRIUM             Index       RIGHT ATRIUM          Index LA diam:        2.60 cm 1.34 cm/m  RA Area:     7.66 cm LA Vol (A2C):   32.3 ml 16.60 ml/m  RA Volume:   12.10 ml 6.22 ml/m LA Vol (A4C):   33.2 ml 17.07 ml/m LA Biplane Vol: 35.9 ml 18.46 ml/m  AORTIC VALVE LVOT Vmax:   79.80 cm/s LVOT Vmean:  61.400 cm/s LVOT VTI:     0.133 m  AORTA Ao Root diam: 3.30 cm Ao Asc diam:  3.00 cm MITRAL VALVE MV Area (PHT): 4.29 cm    SHUNTS MV Decel Time: 177 msec    Systemic VTI:  0.13 m MV E velocity: 70.60 cm/s  Systemic Diam: 2.10 cm MV A velocity: 91.90 cm/s MV E/A ratio:  0.77 Armanda Magic MD Electronically signed by Armanda Magic MD Signature Date/Time: 05/06/2020/12:37:59 PM    Final     Procedures Procedures   Medications Ordered in ED Medications  metoprolol tartrate (LOPRESSOR) injection 5 mg (has no administration in time range)  metoprolol succinate (TOPROL-XL) 24 hr tablet 12.5 mg (has no administration in time range)    ED Course  I have reviewed the triage vital signs and the nursing notes.  Pertinent labs & imaging results that were available during my care of the patient were reviewed by me and considered in my medical decision making (see chart for details).  Clinical Course as of 05/06/20 1407  Wed May 06, 2020  1128 Consult: Reviewed with cardiology Dr.sunit Tolia.  He has reviewed EKGs.  Does not suspect WPW.  Recommends obtaining echo in the emergency department.  If echo shows no structural disease plan to discharge on Toprol-XL 25 mg daily and he will schedule patient for outpatient follow-up in cardiology clinic [MP]    Clinical Course User Index [MP] Arby Barrette, MD   MDM Rules/Calculators/A&P                          Patient presents with recurrent episodes of high rate sinus tachycardia.  Patient had been evaluated by myself 10 days previously.  At this time, there does not appear to be interim development of infectious cause.  Metabolic panel and diagnostic work-up remains within normal limits.  Echocardiogram added per consultation with cardiology.  Patient remains clinically well in appearance.  At this time structural cardiac etiology, metabolic causes, and medication/environmental causes appear ruled out.  We will proceed with initiating metoprolol per discussion with Dr. Sharin Grave.  Patient  will have follow-up with cardiology and return precautions reviewed. Final Clinical Impression(s) / ED Diagnoses Final diagnoses:  Sinus tachycardia  Lightheadedness    Rx / DC Orders ED Discharge Orders         Ordered    metoprolol succinate (TOPROL-XL) 25 MG 24 hr tablet  Daily        05/06/20 1339           Arby Barrette, MD 05/10/20 [phone]

## 2020-05-06 NOTE — ED Triage Notes (Signed)
Patient here with complaint of lightheadedness and shortness of breath that started suddenly around 0600 this morning. Heart rate is 140 in triage. Patient was recently prescribed lorazepam for anxiety which he took this morning with no relief. Patient states he was also recently prescribed metoprolol for when his heart rate is over 100bpm but he did not have that with him at work this morning so he did not take it. Patient alert, oriented, and in no apparent distress at this time.

## 2020-05-06 NOTE — Discharge Instructions (Addendum)
1.  Start taking Toprol-XL 25 mg tomorrow morning.  Monitor your heart rate and your blood pressure.  If your heart rate is going lower than 60 to 70 bpm, take half of the dose the next day.  If your blood pressure is less than 110/60 and/or you feel lightheaded, decrease your dose by half. 2.  Follow-up with Dr. Odis Hollingshead, a [facility].  March 3 at 3 PM as scheduled. 3.  Return to the emergency department if you have worsening or new and concerning symptoms

## 2020-05-06 NOTE — ED Notes (Signed)
Pt ambulated well around the room o2 never dropped below 98% pt did not feel like he was short of breath

## 2020-05-14 ENCOUNTER — Encounter: Payer: Self-pay | Admitting: Cardiology

## 2020-05-14 ENCOUNTER — Ambulatory Visit: Payer: Commercial Managed Care - PPO | Admitting: Cardiology

## 2020-05-14 ENCOUNTER — Other Ambulatory Visit: Payer: Self-pay

## 2020-05-14 VITALS — BP 141/83 | HR 91 | Temp 99.6°F | Resp 16 | Ht 66.0 in | Wt 173.4 lb

## 2020-05-14 DIAGNOSIS — R002 Palpitations: Secondary | ICD-10-CM

## 2020-05-14 DIAGNOSIS — Z8616 Personal history of COVID-19: Secondary | ICD-10-CM

## 2020-05-14 DIAGNOSIS — Z87891 Personal history of nicotine dependence: Secondary | ICD-10-CM

## 2020-05-14 DIAGNOSIS — R000 Tachycardia, unspecified: Secondary | ICD-10-CM

## 2020-05-14 MED ORDER — METOPROLOL SUCCINATE ER 25 MG PO TB24: 25.0000 mg | ORAL_TABLET | Freq: Every day | ORAL | 0 refills | Status: DC

## 2020-05-14 NOTE — Progress Notes (Signed)
Date:  05/14/2020   ID:  Eusebio Friendly, DOB Aug 03, 1990, MRN [id number]  PCP:  Patient, No Pcp Per  Cardiologist:  Tessa Lerner, DO, [facility] (established care 05/14/2020)  REASON FOR CONSULT: Palpitations and tachycardia and hospitalization follow-up  REQUESTING PHYSICIAN:  Dr. Clarice Pole, [facility], ER physician  Chief Complaint  Patient presents with  . Tachycardia  . Hospitalization Follow-up    HPI  Arthur Ortega is a 30 y.o. male who presents to the office with a chief complaint of " palpitations." Patient's past medical history and cardiovascular risk factors include: Former smoker, history of COVID-19 infection.  Patient is referred to the office after recent hospitalization visits in the ER for palpitations/tachycardia  Palpitations: Patient states that his symptoms of palpitations started about 3 to 4 weeks ago right after his COVID-19 infection.  Since then he has had at least 2 ER visits for similar complaints.  He has undergone CT PE protocol which is negative for pulmonary embolism and pneumonia per impressions.  Troponins were negative x2.  At the last ER visit he was recommended to have an echocardiogram to rule out structural heart disease and to evaluate LVEF.  And he was discharged home on Toprol-XL 25 mg p.o. daily.  He now presents for follow-up.  Since initiation of metoprolol patient states that his symptoms have improved but not resolved.  He notices palpitations mostly in the early hours of the morning when he goes to sleep at night.  For the last 1 month he has completely stopped the consumption of coffee or energy drinks.  He does not use any herbal supplements, over-the-counter medications, stimulants, or weight loss medications.  Associated symptoms include shortness of breath.  Symptoms do improve with applying an ice pack to the forehead.  Patient has an upcoming appointment to establish care with PCP later in March 2022.  Patient states that he works 2 jobs, has two  kids under the year of 2, and her youngest daughter heart surgery for congenital heart in [place] later this month as well.  FUNCTIONAL STATUS: Works at a [employer]. No structured exercise program or daily routine.   ALLERGIES: No Known Allergies  MEDICATION LIST PRIOR TO VISIT: Current Meds  Medication Sig  . acetaminophen (TYLENOL) 500 MG tablet Take 500 mg by mouth every 6 (six) hours as needed for mild pain or headache.  . Clobetasol Propionate 0.05 % lotion Apply topically.  . fluticasone (FLONASE) 50 MCG/ACT nasal spray Place 2 sprays into both nostrils daily. (Patient taking differently: Place 2 sprays into both nostrils daily as needed for allergies or rhinitis.)  . ketoconazole (NIZORAL) 2 % shampoo Apply 1 application topically 2 (two) times a week.  Marland Kitchen LORazepam (ATIVAN) 1 MG tablet Take 1 tablet (1 mg total) by mouth 2 (two) times daily as needed for anxiety.  . Multiple Vitamins-Minerals (EMERGEN-C IMMUNE PLUS/VIT D PO) Take 1 tablet by mouth daily.  . SKYRIZI 150 MG/ML SOSY Inject 150 mg into the muscle every 3 (three) months.  . [DISCONTINUED] metoprolol succinate (TOPROL-XL) 25 MG 24 hr tablet Take 1 tablet (25 mg total) by mouth daily.     PAST MEDICAL HISTORY: Past Medical History:  Diagnosis Date  . History of COVID-19     PAST SURGICAL HISTORY: History reviewed. No pertinent surgical history.  FAMILY HISTORY: The patient family history includes Drug abuse in his mother.  SOCIAL HISTORY:  The patient  reports that he quit smoking about 2 years ago. His smoking use included  cigarettes. He has a 1.50 pack-year smoking history. He has never used smokeless tobacco. He reports current alcohol use. He reports previous drug use. Drug: Marijuana.  REVIEW OF SYSTEMS: Review of Systems  Constitutional: Negative for chills and fever.  HENT: Negative for hoarse voice and nosebleeds.   Eyes: Negative for discharge, double vision and pain.  Cardiovascular: Positive  for palpitations. Negative for chest pain, claudication, dyspnea on exertion, leg swelling, near-syncope, orthopnea, paroxysmal nocturnal dyspnea and syncope.  Respiratory: Positive for shortness of breath. Negative for hemoptysis.   Musculoskeletal: Negative for muscle cramps and myalgias.  Gastrointestinal: Negative for abdominal pain, constipation, diarrhea, hematemesis, hematochezia, melena, nausea and vomiting.  Neurological: Negative for dizziness and light-headedness.    PHYSICAL EXAM: Vitals with BMI 05/14/2020 05/14/2020 05/06/2020  Height - 5\' 6"  -  Weight - 173 lbs 6 oz -  BMI - 28 -  Systolic 141 136 -  Diastolic 83 91 -  Pulse 91 91    CONSTITUTIONAL: Well-developed and well-nourished. No acute distress.  SKIN: Skin is warm and dry. No rash noted. No cyanosis. No pallor. No jaundice HEAD: Normocephalic and atraumatic.  EYES: No scleral icterus MOUTH/THROAT: Moist oral membranes.  NECK: No JVD present. No thyromegaly noted. No carotid bruits  LYMPHATIC: No visible cervical adenopathy.  CHEST Normal respiratory effort. No intercostal retractions  LUNGS: Clear to auscultation bilaterally.  No stridor. No wheezes. No rales.  CARDIOVASCULAR: Tachycardia, positive S1-S2, no murmurs rubs or gallops appreciated. ABDOMINAL: No apparent ascites.  EXTREMITIES: No peripheral edema  HEMATOLOGIC: No significant bruising NEUROLOGIC: Oriented to person, place, and time. Nonfocal. Normal muscle tone.  PSYCHIATRIC: Normal mood and affect. Normal behavior. Cooperative  Radiology: CTA chest PE protocol 04/16/2020: Negative chest CTA.  No pulmonary embolism or pneumonia.  CARDIAC DATABASE: EKG: 05/14/2020: Normal sinus rhythm, 99 bpm, normal axis, without underlying injury pattern.  Echocardiogram: 05/06/2020: 1. Left ventricular ejection fraction, by estimation, is 60 to 65%. The left ventricle has normal function. The left ventricle has no regional wall motion abnormalities. Left  ventricular diastolic parameters were normal.  2. Right ventricular systolic function is normal. The right ventricular size is normal.  3. The mitral valve is normal in structure. No evidence of mitral valve regurgitation. No evidence of mitral stenosis.  4. The aortic valve is normal in structure. Aortic valve regurgitation is not visualized. No aortic stenosis is present.  5. The inferior vena cava is normal in size with greater than 50% respiratory variability, suggesting right atrial pressure of 3 mmHg.   Stress Testing: No results found for this or any previous visit from the past 1095 days.  LABORATORY DATA: CBC Latest Ref Rng & Units 05/06/2020 04/16/2020 01/07/2020  WBC 4.0 - 10.5 K/uL 14.2(H) 14.6(H) 11.0(H)  Hemoglobin 13.0 - 17.0 g/dL 01/09/2020 62.6 94.8  Hematocrit 39.0 - 52.0 % 45.7 47.5 40.2  Platelets 150 - 400 K/uL 249 314 183    CMP Latest Ref Rng & Units 05/06/2020 04/16/2020 01/07/2020  Glucose 70 - 99 mg/dL 01/09/2020) 270(J) 500(X)  BUN 6 - 20 mg/dL 12 9 9   Creatinine 0.61 - 1.24 mg/dL 381(W 2.99  Sodium 135 - 145 mmol/L 138 137 138  Potassium 3.5 - 5.1 mmol/L 3.9 4.0 3.9  Chloride 98 - 111 mmol/L 104 104 105  CO2 22 - 32 mmol/L 21(L) 22 24  Calcium 8.9 - 10.3 mg/dL 3.71 9.8 9.2  Total Protein 6.5 - 8.1 g/dL 7.2 - -  Total Bilirubin 0.3 - 1.2 mg/dL  0.6 - -  Alkaline Phos 38 - 126 U/L 104 - -  AST 15 - 41 U/L 34 - -  ALT 0 - 44 U/L 44 - -    Lipid Panel  No results found for: CHOL, TRIG, HDL, CHOLHDL, VLDL, LDLCALC, LDLDIRECT, LABVLDL  No components found for: NTPROBNP No results for input(s): PROBNP in the last 8760 hours. Recent Labs    04/16/20 0922 05/06/20 0845  TSH 1.393 1.707    BMP Recent Labs    01/07/20 2019 04/16/20 0832 05/06/20 0845  NA 138 137 138  K 3.9 4.0 3.9  CL 105 104 104  CO2 24 22 21*  GLUCOSE 125* 136* 144*  BUN 9 9 12   CREATININE 0.89 0.78 0.75  CALCIUM 9.2 9.8 10.1  GFRNONAA >60 >60 >60    HEMOGLOBIN A1C Lab Results   Component Value Date   HGBA1C 4.9 05/06/2020   MPG 93.93 05/06/2020    IMPRESSION:    ICD-10-CM   1. Palpitations  R00.2 metoprolol succinate (TOPROL-XL) 25 MG 24 hr tablet    LONG TERM MONITOR (3-14 DAYS)  2. Tachycardia  R00.0 EKG 12-Lead    metoprolol succinate (TOPROL-XL) 25 MG 24 hr tablet  3. History of COVID-19  Z86.16   4. Former smoker  Z87.891      RECOMMENDATIONS: Arthur Ortega is a 30 y.o. male whose past medical history and cardiac risk factors include: COVID-19 infection, former smoker.  Palpitations: No known reversible causes. Started shortly after his COVID-19 infection. TSH within normal limits. Patient had to ER visits for similar complaints.  He is undergone a CTA PE protocol findings noted above.  Troponins have been negative x2 in the past. He is also undergone an echocardiogram which notes normal LVEF without any significant pericardial effusion. Patient symptoms have improved since initiation of Toprol-XL. 14-day extended Holter monitor to evaluate for dysrhythmias. He is educated on importance of decreasing the amount of stress, sleeping 6 to 8 hours a day, staying well-hydrated, and eating 3 balanced meals.  In addition, when the symptoms arise he is educated on bearing down, Valsalva maneuvers, or applying cold water to his face to see if the symptoms are terminated. EKG performed and personally interpreted and findings noted above. Patient has upcoming appointment with his currently managed by primary care provider.  I have asked him to have another CBC done to make sure the white count is trending down. I suspect that secondary to his recent COVID-19 infection and underlying inflammation.   However, we will check a cardiac monitor to rule out the presence of dysrhythmias.  Sinus tachycardia: Continue Toprol-XL 25 mg p.o. daily.  Continue to monitor  Former smoker: Educated on the importance of continued smoking cessation.  FINAL MEDICATION LIST END OF  ENCOUNTER: Meds ordered this encounter  Medications  . metoprolol succinate (TOPROL-XL) 25 MG 24 hr tablet    Sig: Take 1 tablet (25 mg total) by mouth daily.    Dispense:  30 tablet    Refill:  0     Current Outpatient Medications:  .  acetaminophen (TYLENOL) 500 MG tablet, Take 500 mg by mouth every 6 (six) hours as needed for mild pain or headache., Disp: , Rfl:  .  Clobetasol Propionate 0.05 % lotion, Apply topically., Disp: , Rfl:  .  fluticasone (FLONASE) 50 MCG/ACT nasal spray, Place 2 sprays into both nostrils daily. (Patient taking differently: Place 2 sprays into both nostrils daily as needed for allergies or rhinitis.), Disp: 16  g, Rfl: 0 .  ketoconazole (NIZORAL) 2 % shampoo, Apply 1 application topically 2 (two) times a week., Disp: , Rfl:  .  LORazepam (ATIVAN) 1 MG tablet, Take 1 tablet (1 mg total) by mouth 2 (two) times daily as needed for anxiety., Disp: 8 tablet, Rfl: 0 .  Multiple Vitamins-Minerals (EMERGEN-C IMMUNE PLUS/VIT D PO), Take 1 tablet by mouth daily., Disp: , Rfl:  .  SKYRIZI 150 MG/ML SOSY, Inject 150 mg into the muscle every 3 (three) months., Disp: , Rfl:  .  metoprolol succinate (TOPROL-XL) 25 MG 24 hr tablet, Take 1 tablet (25 mg total) by mouth daily., Disp: 30 tablet, Rfl: 0  Orders Placed This Encounter  Procedures  . LONG TERM MONITOR (3-14 DAYS)  . EKG 12-Lead    There are no Patient Instructions on file for this visit.   --Continue cardiac medications as reconciled in final medication list. --Return in about 6 weeks (around 06/25/2020) for Palpitations. Or sooner if needed. --Continue follow-up with your primary care physician regarding the management of your other chronic comorbid conditions.  Patient's questions and concerns were addressed to his satisfaction. He voices understanding of the instructions provided during this encounter.   This note was created using a voice recognition software as a result there may be grammatical errors  inadvertently enclosed that do not reflect the nature of this encounter. Every attempt is made to correct such errors.  Total encounter time 65 minutes. *Total Encounter Time as defined by the Centers for Medicare and Medicaid Services includes, in addition to the face-to-face time of a patient visit (documented in the note above) non-face-to-face time: obtaining and reviewing outside history, reviewed prior ER visit summaries, CTA PE protocol, echocardiogram results, lab work, ordering and reviewing medications, tests or procedures, care coordination (communications with other health care professionals or caregivers) and documentation in the medical record.   Tessa LernerSunit Demarques Pilz, [place]DO, Lowell General HospitalFACC  Pager: [phone] Office: [phone]

## 2020-05-18 ENCOUNTER — Inpatient Hospital Stay: Payer: Commercial Managed Care - PPO

## 2020-05-18 DIAGNOSIS — R002 Palpitations: Secondary | ICD-10-CM

## 2020-06-23 ENCOUNTER — Other Ambulatory Visit: Payer: Self-pay

## 2020-06-23 ENCOUNTER — Inpatient Hospital Stay: Payer: Commercial Managed Care - PPO

## 2020-06-23 ENCOUNTER — Ambulatory Visit: Payer: Commercial Managed Care - PPO | Admitting: Cardiology

## 2020-06-29 ENCOUNTER — Other Ambulatory Visit: Payer: Self-pay

## 2020-06-29 DIAGNOSIS — R000 Tachycardia, unspecified: Secondary | ICD-10-CM

## 2020-06-29 DIAGNOSIS — R002 Palpitations: Secondary | ICD-10-CM

## 2020-06-30 MED ORDER — METOPROLOL SUCCINATE ER 25 MG PO TB24: 25.0000 mg | ORAL_TABLET | Freq: Every day | ORAL | 0 refills | Status: DC

## 2020-08-04 ENCOUNTER — Other Ambulatory Visit: Payer: Self-pay

## 2020-08-04 DIAGNOSIS — R000 Tachycardia, unspecified: Secondary | ICD-10-CM

## 2020-08-04 DIAGNOSIS — R002 Palpitations: Secondary | ICD-10-CM

## 2020-08-05 ENCOUNTER — Other Ambulatory Visit: Payer: Self-pay

## 2020-08-05 DIAGNOSIS — R000 Tachycardia, unspecified: Secondary | ICD-10-CM

## 2020-08-05 DIAGNOSIS — R002 Palpitations: Secondary | ICD-10-CM

## 2020-08-05 MED ORDER — METOPROLOL SUCCINATE ER 25 MG PO TB24: 25.0000 mg | ORAL_TABLET | Freq: Every day | ORAL | 0 refills | Status: DC

## 2020-08-13 ENCOUNTER — Other Ambulatory Visit: Payer: Self-pay

## 2020-08-13 ENCOUNTER — Encounter: Payer: Self-pay | Admitting: Cardiology

## 2020-08-13 ENCOUNTER — Ambulatory Visit: Payer: Commercial Managed Care - PPO | Admitting: Cardiology

## 2020-08-13 VITALS — BP 123/80 | HR 96 | Temp 98.3°F | Resp 16 | Ht 66.0 in | Wt 175.8 lb

## 2020-08-13 DIAGNOSIS — R002 Palpitations: Secondary | ICD-10-CM

## 2020-08-13 DIAGNOSIS — Z712 Person consulting for explanation of examination or test findings: Secondary | ICD-10-CM

## 2020-08-13 DIAGNOSIS — R000 Tachycardia, unspecified: Secondary | ICD-10-CM

## 2020-08-13 DIAGNOSIS — Z8616 Personal history of COVID-19: Secondary | ICD-10-CM

## 2020-08-13 DIAGNOSIS — Z87891 Personal history of nicotine dependence: Secondary | ICD-10-CM

## 2020-08-13 NOTE — Progress Notes (Signed)
Date:  08/13/2020   ID:  Eusebio Friendly, DOB 13-Nov-1990, MRN [id number]  PCP:  Patient, No Pcp Per (Inactive)  Cardiologist:  Tessa Lerner, DO, [facility] (established care 05/14/2020)  Date: 08/13/20 Last Office Visit: 05/14/2020  Chief Complaint  Patient presents with  . Palpitations  . Follow-up    6 weeks    HPI  Arthur Ortega is a 30 y.o. male who presents to the office with a chief complaint of " reevaluation of palpitations and review test results." Patient's past medical history and cardiovascular risk factors include: Former smoker, history of COVID-19 infection.  Patient is referred to the office after recent hospitalization visits in the ER for palpitations/tachycardia  Patient is here for a 68-month follow-up visit to reevaluate palpitations.  Patient states that he is currently taking Toprol-XL on a daily basis and it has helped his symptoms significantly.  At times he notices random episodes of palpitations usually at night while he is resting and takes Xanax as needed.  It appears that the Xanax has helped his symptoms for now.  Patient had a 14-day extended Holter monitor since last office visit results were reviewed and overall were very favorable.  Since last visit he is also had a COVID-19 booster and has done well.  Patient states that the amount of stress that he was dealing with has improved significantly.  But still works 2 jobs.  FUNCTIONAL STATUS: Works at a [employer]. No structured exercise program or daily routine.   ALLERGIES: No Known Allergies  MEDICATION LIST PRIOR TO VISIT: Current Meds  Medication Sig  . acetaminophen (TYLENOL) 500 MG tablet Take 500 mg by mouth every 6 (six) hours as needed for mild pain or headache.  . fluticasone (FLONASE) 50 MCG/ACT nasal spray Place 2 sprays into both nostrils daily. (Patient taking differently: Place 2 sprays into both nostrils daily as needed for allergies or rhinitis.)  . LORazepam (ATIVAN) 1 MG tablet Take 1  tablet (1 mg total) by mouth 2 (two) times daily as needed for anxiety.  . metoprolol succinate (TOPROL-XL) 25 MG 24 hr tablet Take 1 tablet (25 mg total) by mouth daily.  . Multiple Vitamins-Minerals (EMERGEN-C IMMUNE PLUS/VIT D PO) Take 1 tablet by mouth daily.  . SKYRIZI 150 MG/ML SOSY Inject 150 mg into the muscle every 3 (three) months.     PAST MEDICAL HISTORY: Past Medical History:  Diagnosis Date  . History of COVID-19     PAST SURGICAL HISTORY: History reviewed. No pertinent surgical history.  FAMILY HISTORY: The patient family history includes Drug abuse in his mother; Heart disease in his half-sister.  SOCIAL HISTORY:  The patient  reports that he quit smoking about 2 years ago. His smoking use included cigarettes. He has a 1.50 pack-year smoking history. He has never used smokeless tobacco. He reports current alcohol use. He reports previous drug use. Drug: Marijuana.  REVIEW OF SYSTEMS: Review of Systems  Constitutional: Negative for chills and fever.  HENT: Negative for hoarse voice and nosebleeds.   Eyes: Negative for discharge, double vision and pain.  Cardiovascular: Positive for palpitations. Negative for chest pain, claudication, dyspnea on exertion, leg swelling, near-syncope, orthopnea, paroxysmal nocturnal dyspnea and syncope.  Respiratory: Negative for hemoptysis and shortness of breath.   Musculoskeletal: Negative for muscle cramps and myalgias.  Gastrointestinal: Negative for abdominal pain, constipation, diarrhea, hematemesis, hematochezia, melena, nausea and vomiting.  Neurological: Negative for dizziness and light-headedness.    PHYSICAL EXAM: Vitals with BMI 08/13/2020 05/14/2020 05/14/2020  Height 5\' 6"  - 5\' 6"   Weight 175 lbs 13 oz - 173 lbs 6 oz  BMI 28.39 - 28  Systolic 123 141  Diastolic 80 83 91  Pulse 96 91 91    CONSTITUTIONAL: Well-developed and well-nourished. No acute distress.  SKIN: Skin is warm and dry. No rash noted. No cyanosis.  No pallor. No jaundice HEAD: Normocephalic and atraumatic.  EYES: No scleral icterus MOUTH/THROAT: Moist oral membranes.  NECK: No JVD present. No thyromegaly noted. No carotid bruits  LYMPHATIC: No visible cervical adenopathy.  CHEST Normal respiratory effort. No intercostal retractions  LUNGS: Clear to auscultation bilaterally.  No stridor. No wheezes. No rales.  CARDIOVASCULAR: Tachycardia, positive S1-S2, no murmurs rubs or gallops appreciated. ABDOMINAL: No apparent ascites.  EXTREMITIES: No peripheral edema  HEMATOLOGIC: No significant bruising NEUROLOGIC: Oriented to person, place, and time. Nonfocal. Normal muscle tone.  PSYCHIATRIC: Normal mood and affect. Normal behavior. Cooperative  Radiology: CTA chest PE protocol 04/16/2020: Negative chest CTA.  No pulmonary embolism or pneumonia.  CARDIAC DATABASE: EKG: 05/14/2020: Normal sinus rhythm, 99 bpm, normal axis, without underlying injury pattern.  Echocardiogram: 05/06/2020: 1. Left ventricular ejection fraction, by estimation, is 60 to 65%. The left ventricle has normal function. The left ventricle has no regional wall motion abnormalities. Left ventricular diastolic parameters were normal.  2. Right ventricular systolic function is normal. The right ventricular size is normal.  3. The mitral valve is normal in structure. No evidence of mitral valve regurgitation. No evidence of mitral stenosis.  4. The aortic valve is normal in structure. Aortic valve regurgitation is not visualized. No aortic stenosis is present.  5. The inferior vena cava is normal in size with greater than 50% respiratory variability, suggesting right atrial pressure of 3 mmHg.   14 day extended Holter monitor: Dominant rhythm normal sinus. Heart rate 42-176 bpm. Avg HR 82 bpm. No atrial fibrillation, nonsustained ventricular tachycardia, high grade AV block, pauses (3 seconds or longer). Total ventricular ectopic burden <1%. Total supraventricular  ectopic burden <1%.  Patient triggered events: 11. Underlying rhythm normal sinus without dysrhythmias.  Stress Testing: No results found for this or any previous visit from the past 1095 days.  LABORATORY DATA: CBC Latest Ref Rng & Units 05/06/2020 04/16/2020 01/07/2020  WBC 4.0 - 10.5 K/uL 14.2(H) 14.6(H) 11.0(H)  Hemoglobin 13.0 - 17.0 g/dL 06/14/2020 01/09/2020 70.6  Hematocrit 39.0 - 52.0 % 45.7 47.5 40.2  Platelets 150 - 400 K/uL 249 314 183    CMP Latest Ref Rng & Units 05/06/2020 04/16/2020 01/07/2020  Glucose 70 - 99 mg/dL 06/14/2020) 01/09/2020) 315(V)  BUN 6 - 20 mg/dL 12 9 9   Creatinine 0.61 - 1.24 mg/dL 761(Y 073(X  Sodium 135 - 145 mmol/L 138 137 138  Potassium 3.5 - 5.1 mmol/L 3.9 4.0 3.9  Chloride 98 - 111 mmol/L 104 104 105  CO2 22 - 32 mmol/L 21(L) 22 24  Calcium 8.9 - 10.3 mg/dL 1.06 9.8 9.2  Total Protein 6.5 - 8.1 g/dL 7.2 - -  Total Bilirubin 0.3 - 1.2 mg/dL 0.6 - -  Alkaline Phos 38 - 126 U/L 104 - -  AST 15 - 41 U/L 34 - -  ALT 0 - 44 U/L 44 - -    Lipid Panel  No results found for: CHOL, TRIG, HDL, CHOLHDL, VLDL, LDLCALC, LDLDIRECT, LABVLDL  No components found for: NTPROBNP No results for input(s): PROBNP in the last 8760 hours. Recent Labs    04/16/20 [id number] 05/06/20 0845  TSH 1.393 1.707    BMP Recent Labs    01/07/20 2019 04/16/20 0832 05/06/20 0845  NA 138 137 138  K 3.9 4.0 3.9  CL 105 104 104  CO2 24 22 21*  GLUCOSE 125* 136* 144*  BUN 9 9 12   CREATININE 0.89 0.78 0.75  CALCIUM 9.2 9.8 10.1  GFRNONAA >60 >60 >60    HEMOGLOBIN A1C Lab Results  Component Value Date   HGBA1C 4.9 05/06/2020   MPG 93.93 05/06/2020    IMPRESSION:    ICD-10-CM   1. Palpitations  R00.2   2. History of COVID-19  Z86.16   3. Tachycardia  R00.0   4. Former smoker  Z87.891   5. Encounter to discuss test results  Z71.2      RECOMMENDATIONS: Arthur Ortega is a 30 y.o. male whose past medical history and cardiac risk factors include: COVID-19 infection, former  smoker.  Palpitations: Improving. Currently on metoprolol succinate.  Had a shared decision with the patient with regards to weaning off Toprol-XL.  I have asked him that starting next week to take Toprol-XL every other day and the following week to take it as needed. With regards to the work-up of anxiety I have deferred him to establishing care with a primary care provider at this time. Reviewed the results of the extended Holter monitor with him at today's visit. Educated him on the importance of working on 26. Echocardiogram results reviewed and overall favorable.  Sinus tachycardia: Improving.  See above.    Former smoker: Educated on the importance of continued smoking cessation.  I will see the patient on as needed basis for reevaluation of palpitations if needed.  Patient is agreeable with the plan of care.  FINAL MEDICATION LIST END OF ENCOUNTER: No orders of the defined types were placed in this encounter.    Current Outpatient Medications:  .  acetaminophen (TYLENOL) 500 MG tablet, Take 500 mg by mouth every 6 (six) hours as needed for mild pain or headache., Disp: , Rfl:  .  fluticasone (FLONASE) 50 MCG/ACT nasal spray, Place 2 sprays into both nostrils daily. (Patient taking differently: Place 2 sprays into both nostrils daily as needed for allergies or rhinitis.), Disp: 16 g, Rfl: 0 .  LORazepam (ATIVAN) 1 MG tablet, Take 1 tablet (1 mg total) by mouth 2 (two) times daily as needed for anxiety., Disp: 8 tablet, Rfl: 0 .  metoprolol succinate (TOPROL-XL) 25 MG 24 hr tablet, Take 1 tablet (25 mg total) by mouth daily., Disp: 30 tablet, Rfl: 0 .  Multiple Vitamins-Minerals (EMERGEN-C IMMUNE PLUS/VIT D PO), Take 1 tablet by mouth daily., Disp: , Rfl:  .  SKYRIZI 150 MG/ML SOSY, Inject 150 mg into the muscle every 3 (three) months., Disp: , Rfl:   No orders of the defined types were placed in this encounter.   There are no Patient Instructions on file for this  visit.   --Continue cardiac medications as reconciled in final medication list. --Return if symptoms worsen or fail to improve. Or sooner if needed. --Continue follow-up with your primary care physician regarding the management of your other chronic comorbid conditions.  Patient's questions and concerns were addressed to his satisfaction. He voices understanding of the instructions provided during this encounter.   This note was created using a voice recognition software as a result there may be grammatical errors inadvertently enclosed that do not reflect the nature of this encounter. Every attempt is made to correct such errors.  Rex Kras, [place], [facility]  Pager: [phone] Office: [phone]

## 2020-09-01 ENCOUNTER — Other Ambulatory Visit: Payer: Self-pay | Admitting: Cardiology

## 2020-09-01 DIAGNOSIS — R002 Palpitations: Secondary | ICD-10-CM

## 2020-09-01 DIAGNOSIS — R000 Tachycardia, unspecified: Secondary | ICD-10-CM

## 2020-09-28 ENCOUNTER — Telehealth: Payer: Commercial Managed Care - PPO | Admitting: Physician Assistant

## 2020-09-28 DIAGNOSIS — R300 Dysuria: Secondary | ICD-10-CM

## 2020-09-29 NOTE — Progress Notes (Signed)
E-Visit for Urinary Problems ? ?Based on what you shared with me, I feel your condition warrants further evaluation and I recommend that you be seen for a face to face office visit.  Male bladder infections are not very common.  We worry about prostate or kidney conditions.  The standard of care is to examine the abdomen and kidneys, and to do a urine and blood test to make sure that something more serious is not going on.  We recommend that you see a provider today.  If your doctor's office is closed [facility] has the following Urgent Cares: ? ?  ?NOTE: You will not be charged for this e-visit. ? ?If you are having a true medical emergency please call 911.   ? ?  ? For an urgent face to face visit, [facility] has six urgent care centers for your convenience:  ?  ? [facility] [place] ?Get Driving Directions ?[phone] ?[address] ?[place], [place] [address] ?  ? [facility] ([place]) ?Get Driving Directions ?[phone] ?[address] ?[place], [place] [address] ? ?[facility] ([place] [facility] [place]) ?Get Driving Directions ?[phone] ?[address] ?,  [place]  [address] ? ?[facility] ?Get Driving Directions ?[phone] ?[address] ?[place], [place] [address] ?  ?[facility] ?Get Driving Directions  ?[phone] ?[address] ?[address] ?[place], [place] [address] ?  ?[facility] [place] ?Get Driving Directions ?[phone] ?[address] ?[place], [place] [address] ? ?Your MyChart E-visit questionnaire answers were reviewed by a board certified advanced clinical practitioner to complete your personal care plan based on your specific symptoms.  Thank you for using e-Visits. ?

## 2021-10-21 IMAGING — CT CT ANGIO CHEST
2 of 6 series · 19 of 46 positions shown · IV contrast (omnipaque)
Comparison: None.

CLINICAL DATA: URI.  Heart racing.  Recent COVID exposure.

EXAM:
CT ANGIOGRAPHY CHEST WITH CONTRAST
TECHNIQUE: Multidetector CT imaging of the chest was performed using the
standard protocol during bolus administration of intravenous
contrast. Multiplanar CT image reconstructions and MIPs were
obtained to evaluate the vascular anatomy.
CONTRAST:  75mL OMNIPAQUE IOHEXOL 350 MG/ML SOLN

[Series 8: thins · axial · 0.83mm/px · z∈[+972,+1266]mm · 16 of 323 slices shown]
[im 15/323  lung]
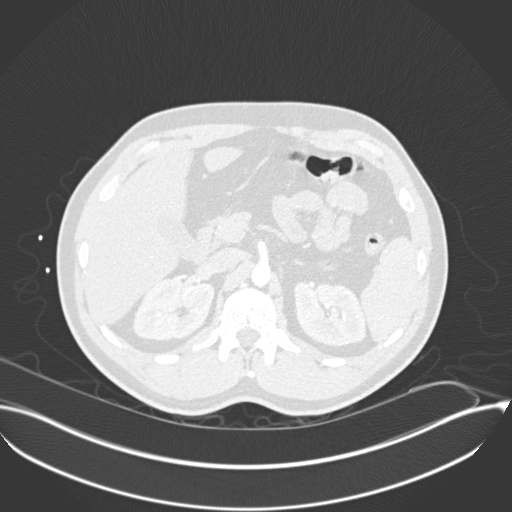
[im 43/323  soft-tissue]
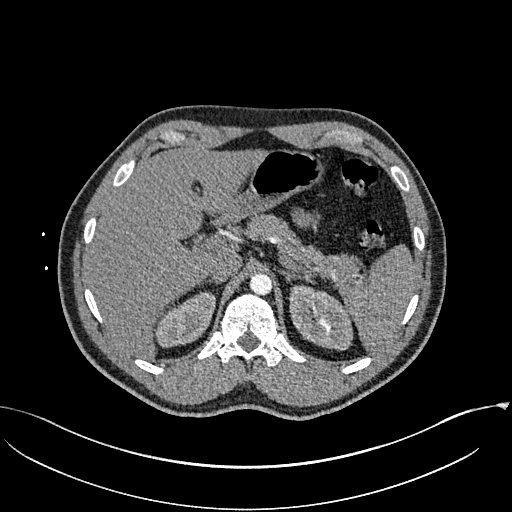
[im 57/323  lung]
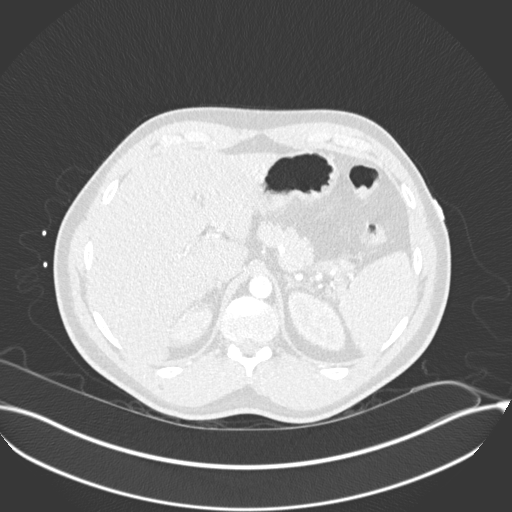
[im 71/323  soft-tissue]
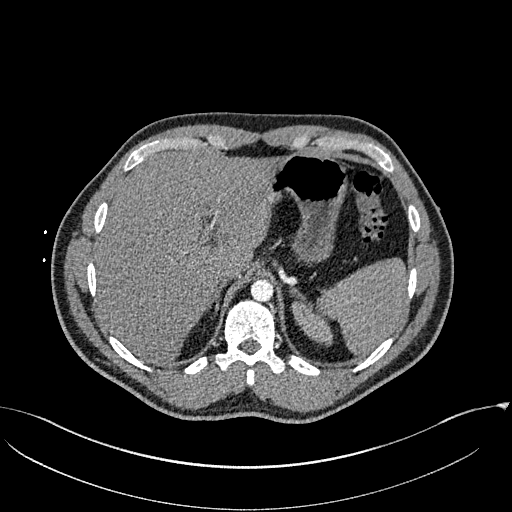
[im 99/323  lung]
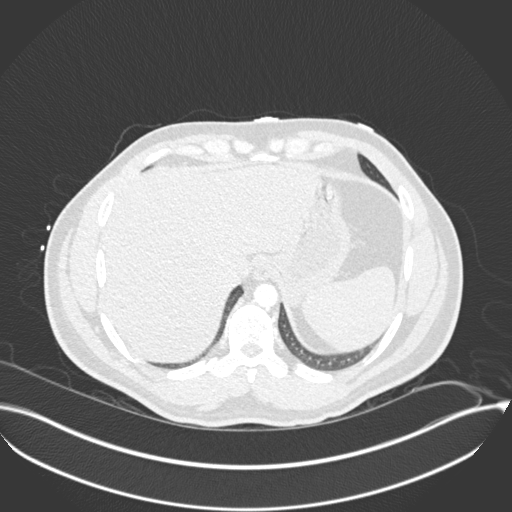
[im 113/323  soft-tissue]
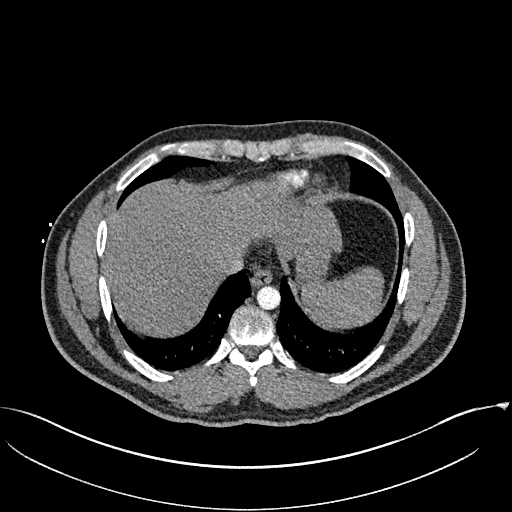
[im 127/323  lung]
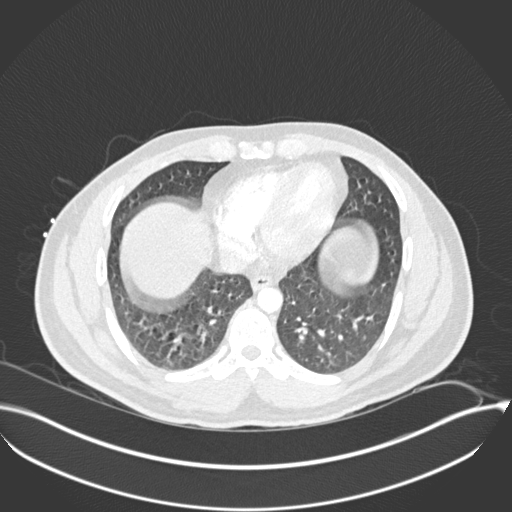
[im 155/323  soft-tissue]
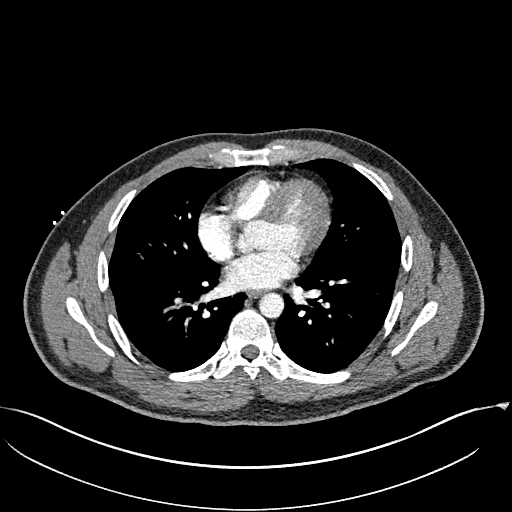
[im 169/323  lung]
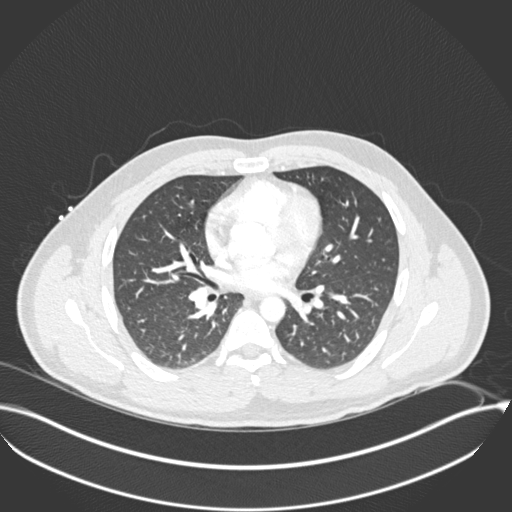
[im 197/323  soft-tissue]
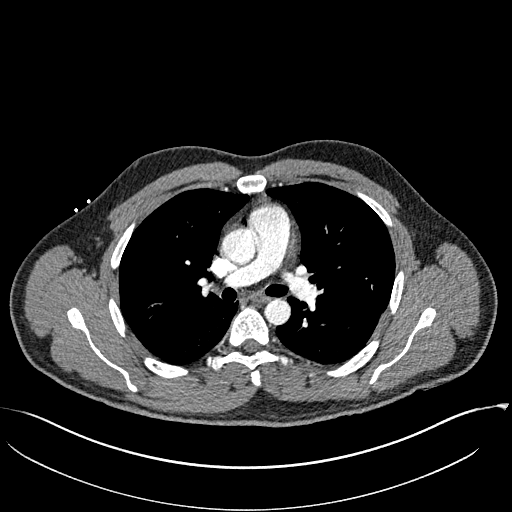
[im 211/323  lung]
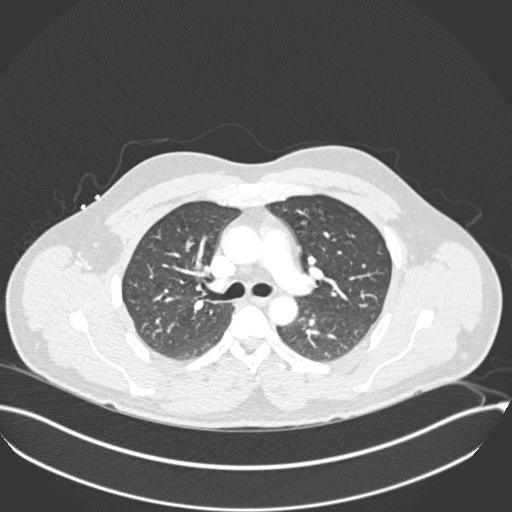
[im 225/323  soft-tissue]
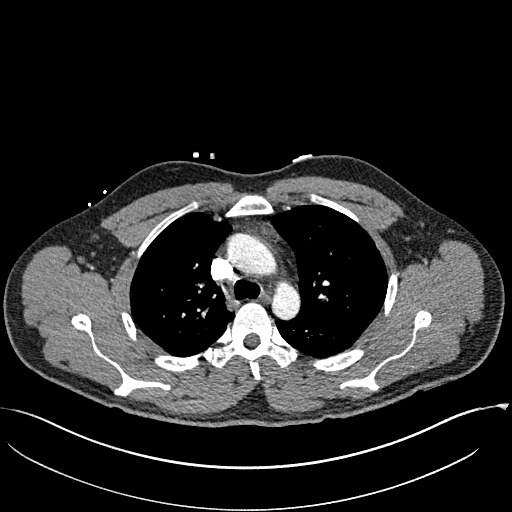
[im 253/323  lung]
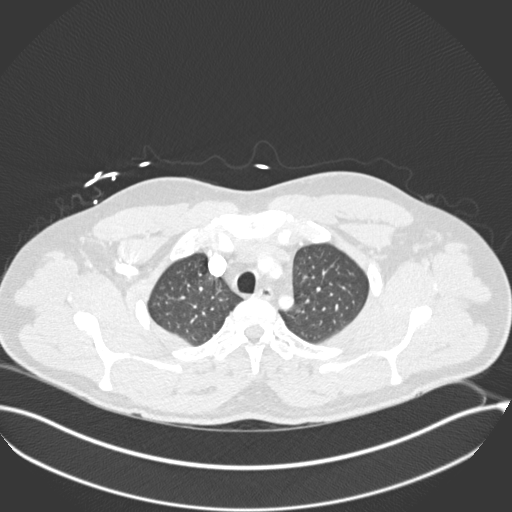
[im 267/323  soft-tissue]
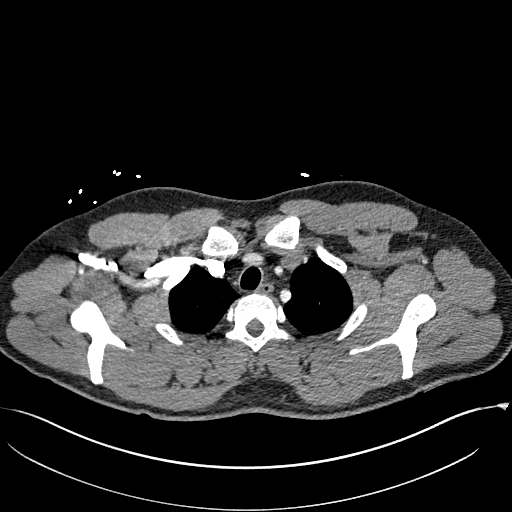
[im 281/323  lung]
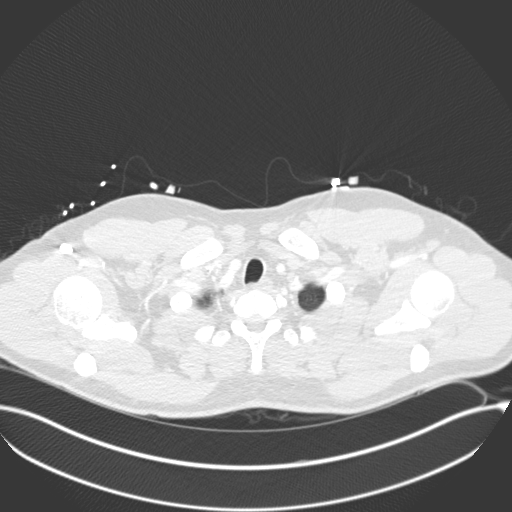
[im 309/323  soft-tissue]
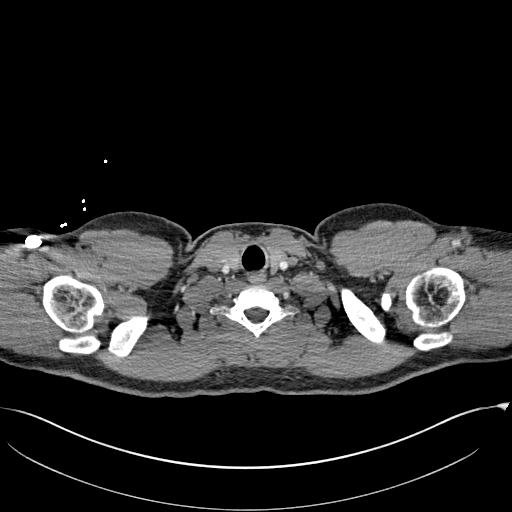

[Series 10: coronal mpr · coronal · 0.67mm/px · 3 of 133 slices shown]
[im 34/133  soft-tissue]
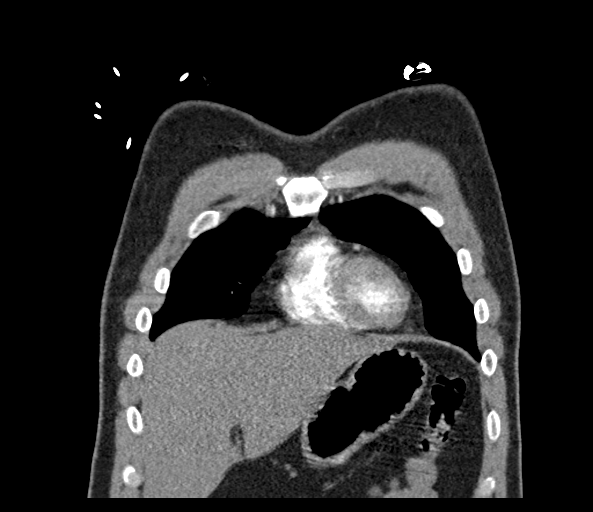
[im 67/133  soft-tissue]
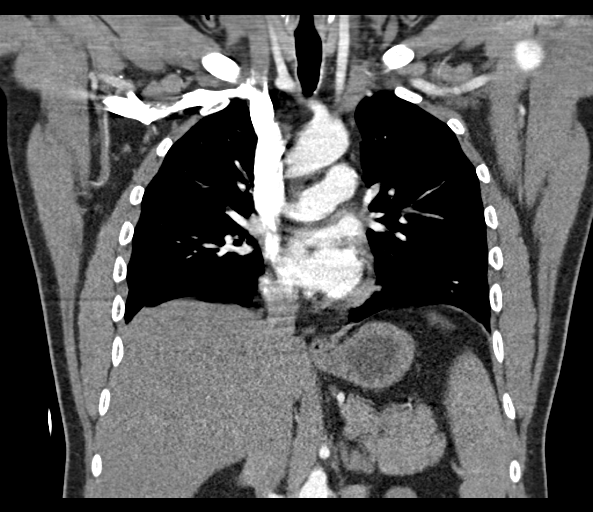
[im 100/133  soft-tissue]
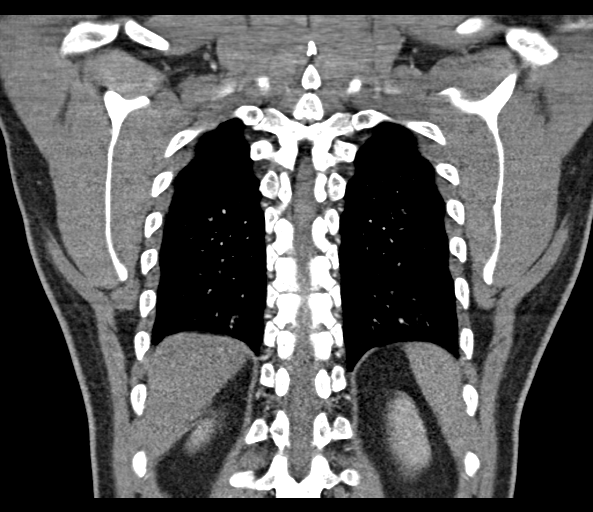

[19 of 46 positions shown; findings below may reference images not displayed]

FINDINGS: Cardiovascular: Satisfactory opacification of the pulmonary arteries
to the segmental level. No evidence of pulmonary embolism when
allowing for levels of motion artifact. Normal heart size. No
pericardial effusion.

Mediastinum/Nodes: Negative for adenopathy or mass.

Lungs/Pleura: No pneumonia, edema, effusion, or air leak

Upper Abdomen: Negative when accounting for early contrast excretion
in the kidneys.

Musculoskeletal: Negative

Review of the MIP images confirms the above findings.
IMPRESSION: Negative chest CTA.  No pulmonary embolism or pneumonia.

## 2021-11-10 IMAGING — CR DG CHEST 2V
2 series · 2 of 2 positions shown · non-contrast
Comparison: Chest radiograph January 07, 2020; chest CT April 16, 2020

CLINICAL DATA: Tachycardia

EXAM:
CHEST - 2 VIEW

[chest pa]
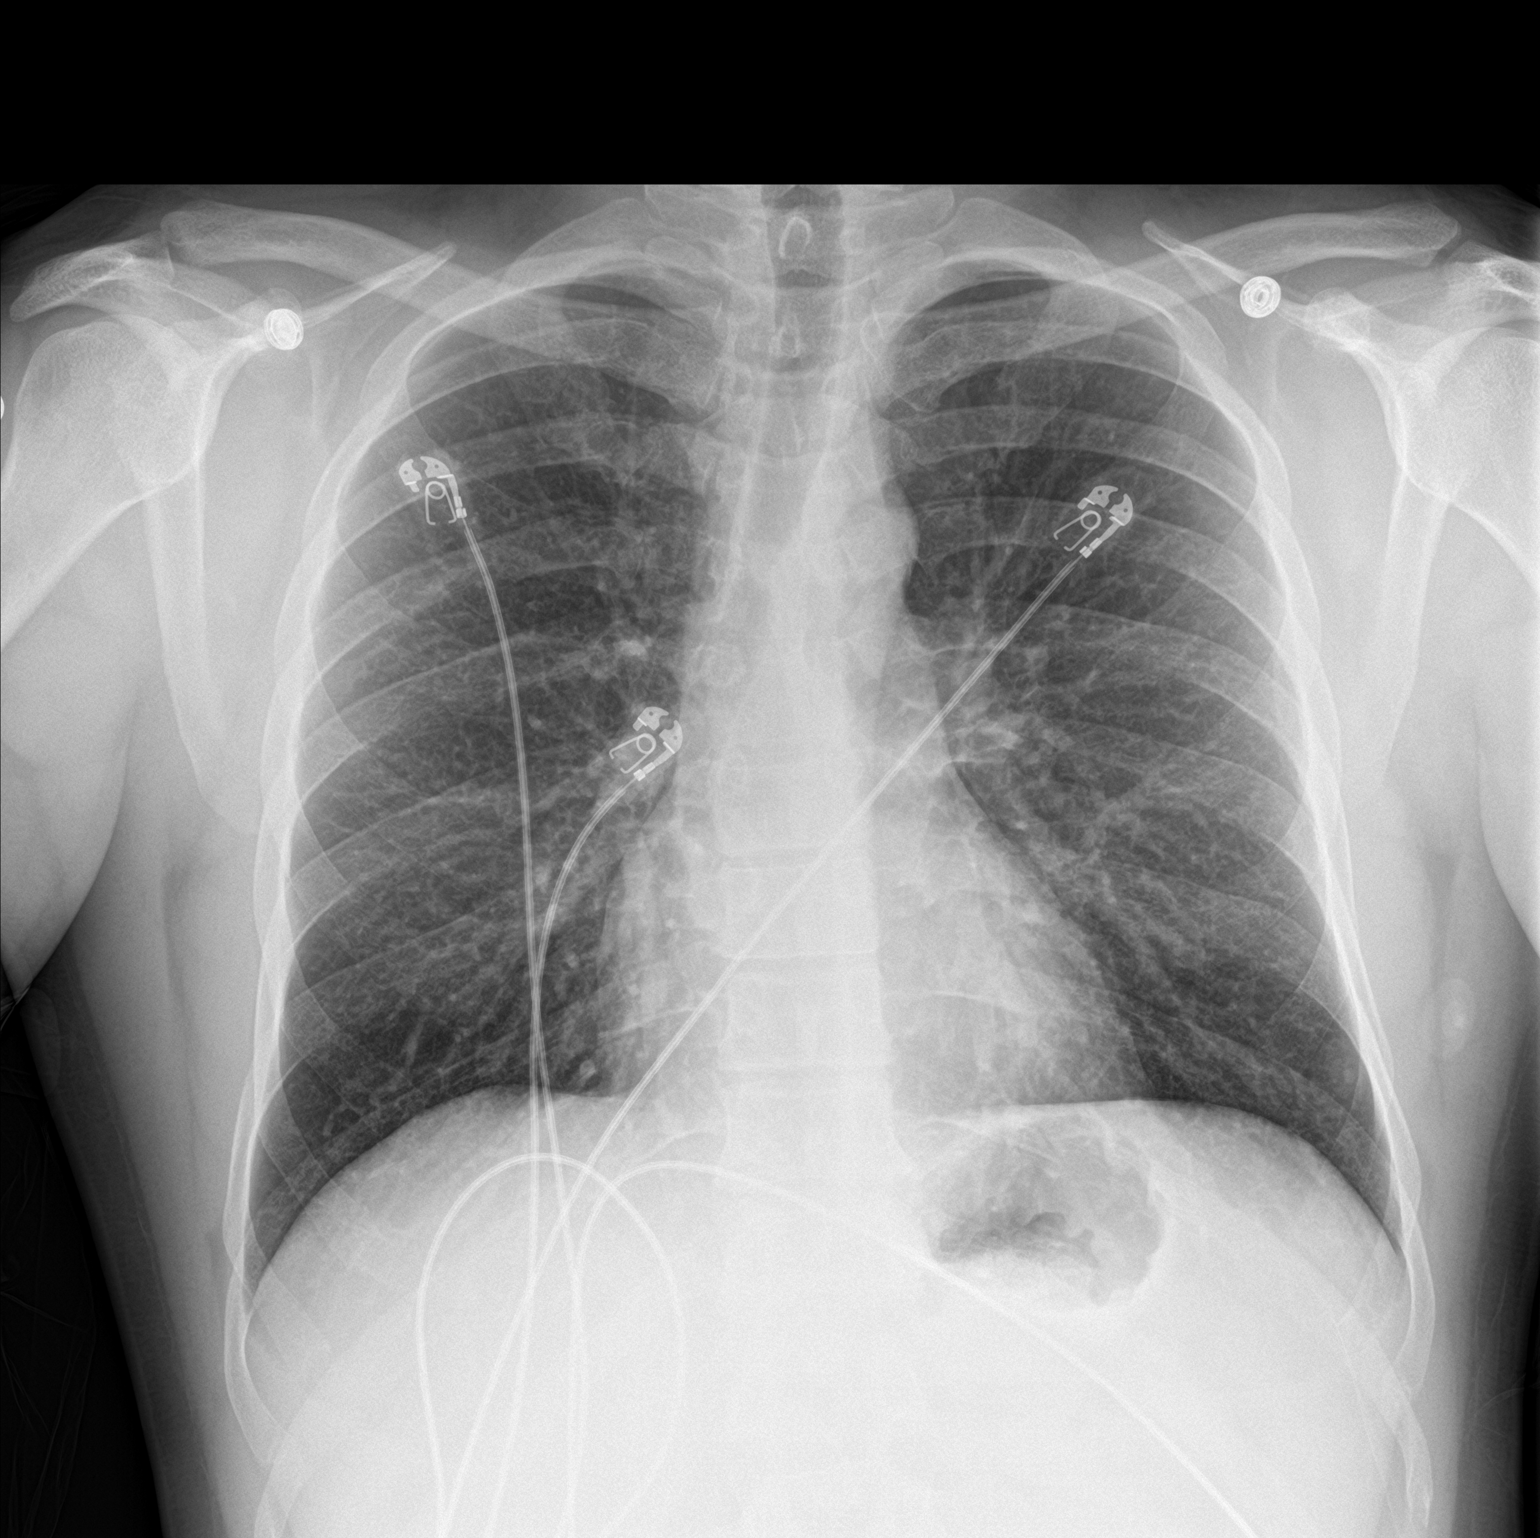

[chest lat]
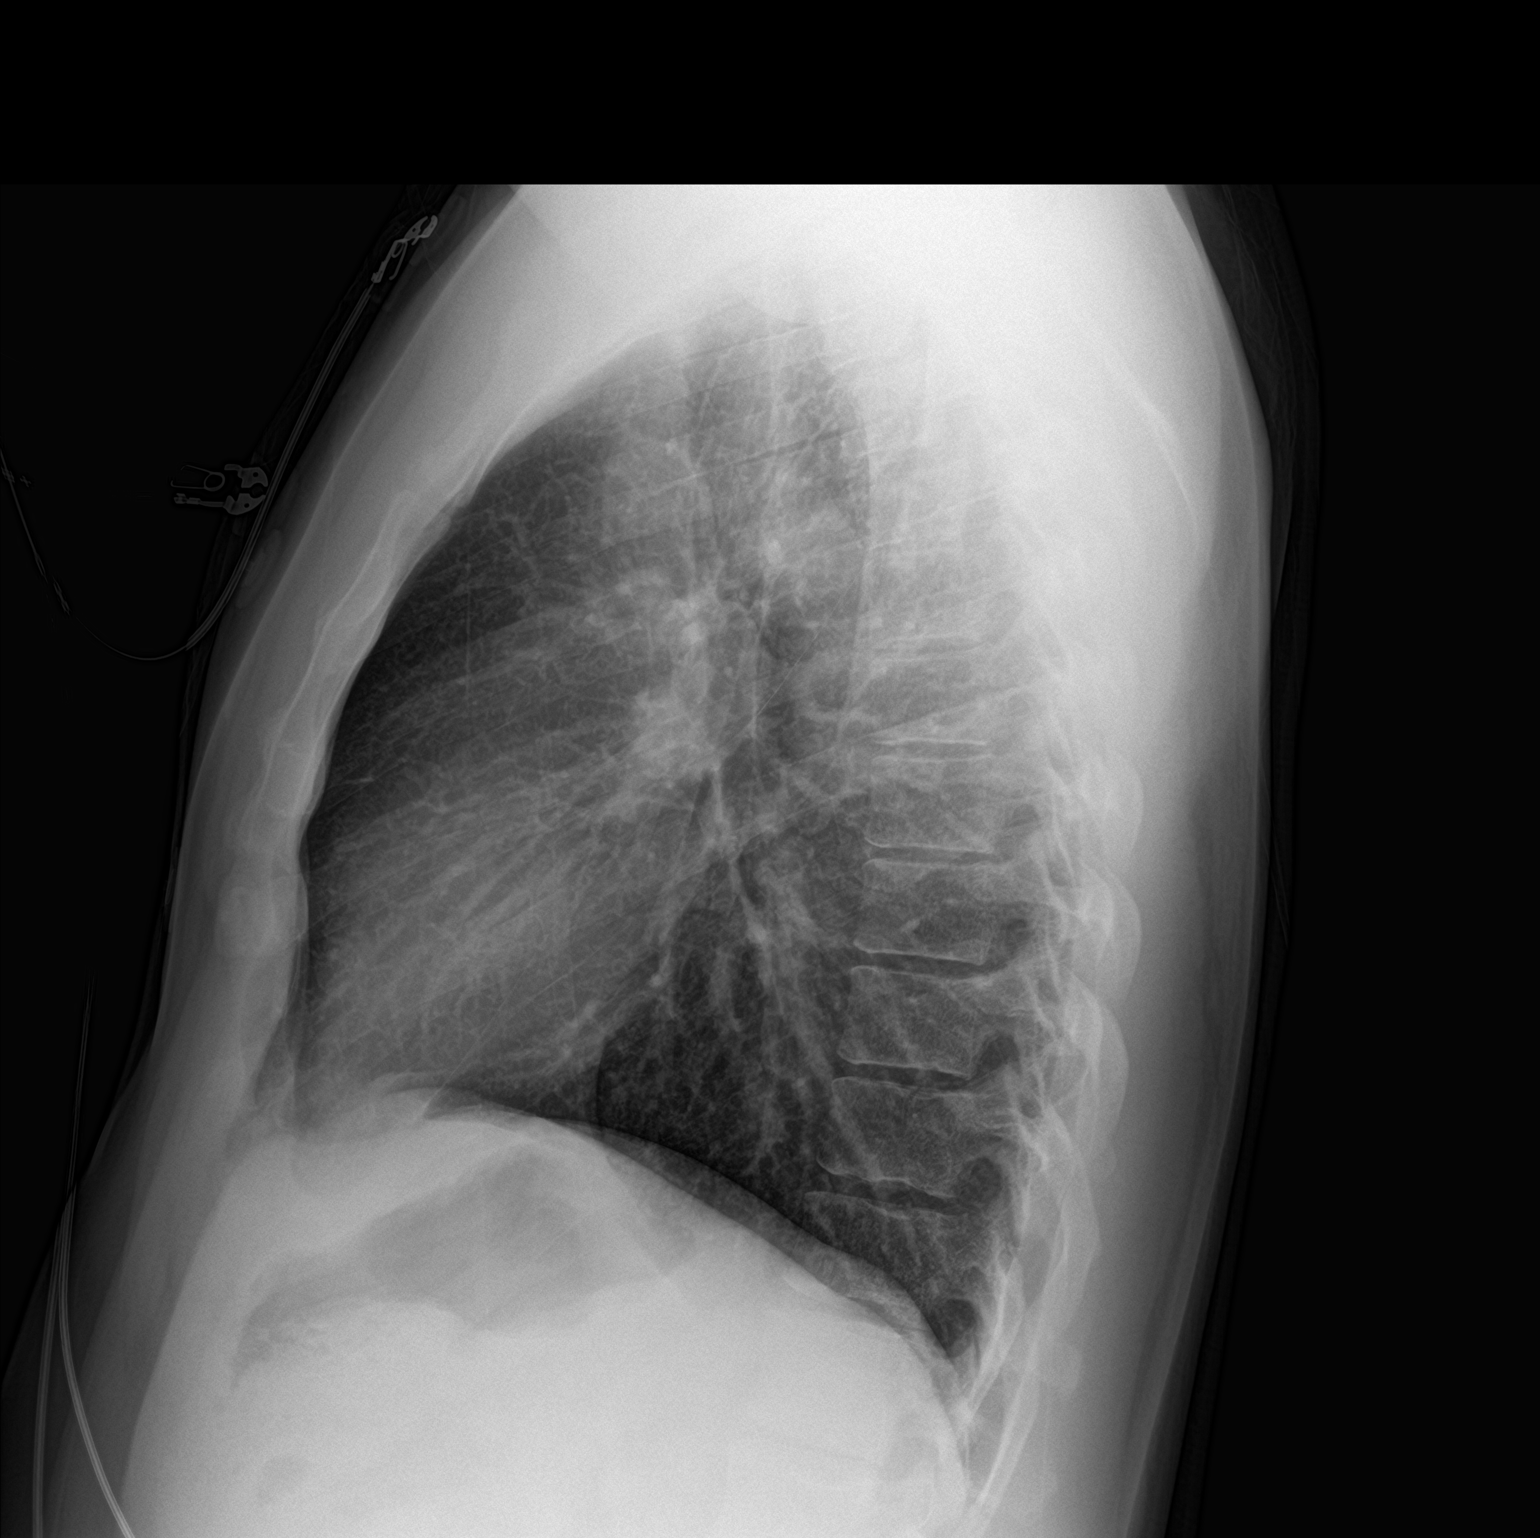

[2 of 2 positions shown; findings below may reference images not displayed]

FINDINGS: Lungs are clear. Heart size and pulmonary vascularity are normal. No
adenopathy. No pneumothorax. No bone lesions.
IMPRESSION: Lungs clear.  Cardiac silhouette normal.
